# Patient Record
Sex: Female | Born: 1980 | Race: White | Hispanic: Yes | Marital: Married | Smoking: Never smoker
Health system: Southern US, Community
[De-identification: ages and names within clinical notes are randomized; demographics above are authoritative.]

## PROBLEM LIST (undated history)

## (undated) ENCOUNTER — Inpatient Hospital Stay (HOSPITAL_COMMUNITY): Payer: Self-pay

## (undated) DIAGNOSIS — D649 Anemia, unspecified: Secondary | ICD-10-CM

## (undated) DIAGNOSIS — E669 Obesity, unspecified: Secondary | ICD-10-CM

## (undated) DIAGNOSIS — N393 Stress incontinence (female) (male): Secondary | ICD-10-CM

## (undated) HISTORY — PX: NO PAST SURGERIES: SHX2092

## (undated) HISTORY — DX: Obesity, unspecified: E66.9

## (undated) HISTORY — DX: Stress incontinence (female) (male): N39.3

## (undated) HISTORY — DX: Anemia, unspecified: D64.9

---
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
## 2004-01-08 ENCOUNTER — Ambulatory Visit: Payer: Self-pay | Admitting: *Deleted

## 2004-01-15 ENCOUNTER — Ambulatory Visit (HOSPITAL_COMMUNITY): Admission: RE | Admit: 2004-01-15 | Discharge: 2004-01-15 | Payer: Self-pay | Admitting: *Deleted

## 2004-01-15 ENCOUNTER — Ambulatory Visit: Payer: Self-pay | Admitting: Family Medicine

## 2004-01-22 ENCOUNTER — Ambulatory Visit: Payer: Self-pay | Admitting: *Deleted

## 2004-01-25 ENCOUNTER — Ambulatory Visit: Payer: Self-pay | Admitting: *Deleted

## 2004-01-29 ENCOUNTER — Ambulatory Visit: Payer: Self-pay | Admitting: *Deleted

## 2004-02-05 ENCOUNTER — Ambulatory Visit: Payer: Self-pay | Admitting: *Deleted

## 2004-02-07 ENCOUNTER — Inpatient Hospital Stay (HOSPITAL_COMMUNITY): Admission: AD | Admit: 2004-02-07 | Discharge: 2004-02-09 | Payer: Self-pay | Admitting: Obstetrics & Gynecology

## 2004-02-07 ENCOUNTER — Ambulatory Visit: Payer: Self-pay | Admitting: Obstetrics & Gynecology

## 2010-05-31 ENCOUNTER — Emergency Department (HOSPITAL_COMMUNITY): Payer: Self-pay

## 2010-05-31 ENCOUNTER — Emergency Department (HOSPITAL_COMMUNITY)
Admission: EM | Admit: 2010-05-31 | Discharge: 2010-05-31 | Disposition: A | Discharge status: Home / Self Care | Payer: Self-pay | Attending: Emergency Medicine | Admitting: Emergency Medicine

## 2010-05-31 DIAGNOSIS — R0789 Other chest pain: Secondary | ICD-10-CM | POA: Insufficient documentation

## 2010-05-31 LAB — DIFFERENTIAL
Basophils Absolute: 0 10*3/uL (ref 0.0–0.1)
Basophils Relative: 0 % (ref 0–1)
Eosinophils Absolute: 0.2 10*3/uL (ref 0.0–0.7)
Eosinophils Relative: 2 % (ref 0–5)
Lymphocytes Relative: 42 % (ref 12–46)
Lymphs Abs: 2.7 10*3/uL (ref 0.7–4.0)
Monocytes Absolute: 0.6 10*3/uL (ref 0.1–1.0)
Monocytes Relative: 9 % (ref 3–12)
Neutro Abs: 3 10*3/uL (ref 1.7–7.7)
Neutrophils Relative %: 46 % (ref 43–77)

## 2010-05-31 LAB — POCT CARDIAC MARKERS
CKMB, poc: <1 ng/mL — ABNORMAL LOW (ref 1.0–8.0)
Myoglobin, poc: 38.7 ng/mL (ref 12–200)
Troponin i, poc: <0.05 ng/mL (ref 0.00–0.09)

## 2010-05-31 LAB — CBC
HCT: 38 % (ref 36.0–46.0)
Hemoglobin: 12.9 g/dL (ref 12.0–15.0)
MCHC: 33.9 g/dL (ref 30.0–36.0)
MCV: 84.6 fL (ref 78.0–100.0)
RDW: 12.2 % (ref 11.5–15.5)

## 2010-05-31 LAB — BASIC METABOLIC PANEL
BUN: 16 mg/dL (ref 6–23)
CO2: 25 mEq/L (ref 19–32)
Calcium: 8.8 mg/dL (ref 8.4–10.5)
GFR calc non Af Amer: >60 mL/min (ref >60)
Glucose, Bld: 92 mg/dL (ref 70–99)
Potassium: 3.8 mEq/L (ref 3.5–5.1)
Sodium: 139 mEq/L (ref 135–145)

## 2010-07-25 ENCOUNTER — Emergency Department (HOSPITAL_COMMUNITY)
Admission: EM | Admit: 2010-07-25 | Discharge: 2010-07-26 | Disposition: A | Discharge status: Home / Self Care | Payer: Self-pay | Attending: Emergency Medicine | Admitting: Emergency Medicine

## 2010-07-25 DIAGNOSIS — L259 Unspecified contact dermatitis, unspecified cause: Secondary | ICD-10-CM | POA: Insufficient documentation

## 2010-07-25 DIAGNOSIS — R21 Rash and other nonspecific skin eruption: Secondary | ICD-10-CM | POA: Insufficient documentation

## 2010-07-25 DIAGNOSIS — L298 Other pruritus: Secondary | ICD-10-CM | POA: Insufficient documentation

## 2010-07-25 DIAGNOSIS — L509 Urticaria, unspecified: Secondary | ICD-10-CM | POA: Insufficient documentation

## 2010-07-25 DIAGNOSIS — L2989 Other pruritus: Secondary | ICD-10-CM | POA: Insufficient documentation

## 2013-12-01 ENCOUNTER — Ambulatory Visit: Payer: Self-pay | Admitting: Gynecology

## 2013-12-05 ENCOUNTER — Other Ambulatory Visit (HOSPITAL_COMMUNITY): Payer: Self-pay | Admitting: *Deleted

## 2013-12-05 DIAGNOSIS — N644 Mastodynia: Secondary | ICD-10-CM

## 2013-12-18 ENCOUNTER — Ambulatory Visit: Payer: Self-pay | Admitting: Gynecology

## 2014-01-03 ENCOUNTER — Encounter (HOSPITAL_COMMUNITY): Payer: Self-pay | Admitting: *Deleted

## 2014-01-04 ENCOUNTER — Ambulatory Visit
Admission: RE | Admit: 2014-01-04 | Discharge: 2014-01-04 | Disposition: A | Discharge status: Home / Self Care | Payer: No Typology Code available for payment source | Source: Ambulatory Visit | Attending: Obstetrics and Gynecology | Admitting: Obstetrics and Gynecology

## 2014-01-04 ENCOUNTER — Ambulatory Visit (HOSPITAL_COMMUNITY)
Admission: RE | Admit: 2014-01-04 | Discharge: 2014-01-04 | Disposition: A | Discharge status: Home / Self Care | Payer: Self-pay | Source: Ambulatory Visit | Attending: Obstetrics and Gynecology | Admitting: Obstetrics and Gynecology

## 2014-01-04 ENCOUNTER — Encounter (HOSPITAL_COMMUNITY): Payer: Self-pay

## 2014-01-04 VITALS — BP 104/60 | Temp 97.9°F | Ht 63.0 in | Wt 167.6 lb

## 2014-01-04 DIAGNOSIS — N644 Mastodynia: Secondary | ICD-10-CM

## 2014-01-04 DIAGNOSIS — Z1239 Encounter for other screening for malignant neoplasm of breast: Secondary | ICD-10-CM

## 2014-01-04 NOTE — Patient Instructions (Signed)
Explained  Caitlin MaserLucy [name] that she did not need a Pap smear today due to last Pap smear was 07/13/2012. Let her know BCCCP will cover Pap smears every 3 years unless has a history of abnormal Pap smears. [facility] and possible right breast ultrasound. Appointment scheduled for Thursday, January 04, 2014 at 1030. Patient aware of appointment and will be there. Caitlin MaserLucy [name] verbalized understanding.  [name], Caitlin Maserhristine Poll, RN 9:04 AM

## 2014-01-04 NOTE — Progress Notes (Signed)
Complaints of left nipple pain that burns. Patient stated the pain comes and goes. Patient rates pain at a 5 out of 10.  Pap Smear: Pap smear not completed today. [facility] and normal. Per patient has no history of an abnormal Pap smear. Last Pap smear result is scanned into EPIC under media.  Physical exam: Breasts Breasts symmetrical. No skin abnormalities bilateral breasts. No nipple retraction bilateral breasts. No nipple discharge bilateral breasts. No lymphadenopathy. No lumps palpated bilateral breasts. Complaints of some tenderness when palpated area around left nipple. [facility] and possible right breast ultrasound. Appointment scheduled for Thursday, January 04, 2014 at 1030.  Pelvic/Bimanual No Pap smear completed today since last Pap smear was 07/13/2012. Pap smear not indicated per BCCCP guidelines.

## 2014-05-08 ENCOUNTER — Ambulatory Visit (INDEPENDENT_AMBULATORY_CARE_PROVIDER_SITE_OTHER): Payer: Self-pay | Admitting: Internal Medicine

## 2014-05-08 VITALS — BP 103/66 | HR 78 | Temp 98.8°F | Resp 20 | Ht 62.0 in | Wt 169.0 lb

## 2014-05-08 DIAGNOSIS — J029 Acute pharyngitis, unspecified: Secondary | ICD-10-CM

## 2014-05-08 MED ORDER — AMOXICILLIN 875 MG PO TABS: 875.0000 mg | ORAL_TABLET | Freq: Two times a day (BID) | ORAL | Status: DC

## 2014-05-08 MED ORDER — LIDOCAINE VISCOUS 2 % MT SOLN: OROMUCOSAL | Status: DC

## 2014-05-08 NOTE — Progress Notes (Signed)
   Subjective:    Patient ID: Caitlin [name], female    DOB: 06-23-1980, 34 y.o.   MRN: [id number] This chart was scribed for Ellamae Siaobert Annayah Worthley, MD by Murriel HopperAlec Bankhead, ED Scribe. The patient's care was started at 8:29 PM.  Chief Complaint  Patient presents with  . Sore Throat      HPI HPI Comments: Caitlin MaserLucy Kyer is a 34 y.o. female who presents to [facility] complaining of a constant, worsening sore throat with associated rhinorrhea that has been present for over a week. Pt denies having seasonal allergies. Pt states pain becomes worse with eating and drinking. Pt denies cough, fever, itching eyes.    Review of Systems  Constitutional: Negative for fever.  HENT: Positive for rhinorrhea and sore throat.   Eyes: Negative for itching.  Respiratory: Negative for cough.        Objective:   Physical Exam  Constitutional: She is oriented to person, place, and time. She appears well-developed and well-nourished.  HENT:  Head: Normocephalic and atraumatic.  Nose clear Throat erythematous with vesicles in posterior pharynx Antero cervical lymph notes tender  Cardiovascular: Normal rate.   Pulmonary/Chest: Effort normal.  Abdominal: She exhibits no distension.  Neurological: She is alert and oriented to person, place, and time.  Skin: Skin is warm and dry.  Psychiatric: She has a normal mood and affect.  Nursing note and vitals reviewed.     Assessment & Plan:  Acute pharyngitis, unspecified pharyngitis type  Bacterial suspected Meds ordered this encounter  Medications  . amoxicillin (AMOXIL) 875 MG tablet    Sig: Take 1 tablet (875 mg total) by mouth 2 (two) times daily.    Dispense:  20 tablet    Refill:  0  . lidocaine (XYLOCAINE) 2 % solution    Sig: Use 1 teaspoon every 2 hours to swish and swallow or spit as needed for pain    Dispense:  60 mL    Refill:  0     I have completed the patient encounter in its entirety as documented by the scribe, with  editing by me where necessary. Thang Flett P. Merla Richesoolittle, M.D.

## 2014-06-12 ENCOUNTER — Encounter (HOSPITAL_COMMUNITY): Payer: Self-pay | Admitting: Emergency Medicine

## 2014-06-12 ENCOUNTER — Emergency Department (HOSPITAL_COMMUNITY)
Admission: EM | Admit: 2014-06-12 | Discharge: 2014-06-12 | Disposition: A | Discharge status: Home / Self Care | Payer: No Typology Code available for payment source | Attending: Emergency Medicine | Admitting: Emergency Medicine

## 2014-06-12 DIAGNOSIS — S199XXA Unspecified injury of neck, initial encounter: Secondary | ICD-10-CM | POA: Insufficient documentation

## 2014-06-12 DIAGNOSIS — Z862 Personal history of diseases of the blood and blood-forming organs and certain disorders involving the immune mechanism: Secondary | ICD-10-CM | POA: Insufficient documentation

## 2014-06-12 DIAGNOSIS — Y998 Other external cause status: Secondary | ICD-10-CM | POA: Insufficient documentation

## 2014-06-12 DIAGNOSIS — O9A211 Injury, poisoning and certain other consequences of external causes complicating pregnancy, first trimester: Secondary | ICD-10-CM | POA: Diagnosis not present

## 2014-06-12 DIAGNOSIS — Y9389 Activity, other specified: Secondary | ICD-10-CM | POA: Diagnosis not present

## 2014-06-12 DIAGNOSIS — Z3A08 8 weeks gestation of pregnancy: Secondary | ICD-10-CM | POA: Diagnosis not present

## 2014-06-12 DIAGNOSIS — Y92410 Unspecified street and highway as the place of occurrence of the external cause: Secondary | ICD-10-CM | POA: Insufficient documentation

## 2014-06-12 DIAGNOSIS — S4990XA Unspecified injury of shoulder and upper arm, unspecified arm, initial encounter: Secondary | ICD-10-CM | POA: Diagnosis not present

## 2014-06-12 DIAGNOSIS — S3992XA Unspecified injury of lower back, initial encounter: Secondary | ICD-10-CM | POA: Insufficient documentation

## 2014-06-12 DIAGNOSIS — Z3490 Encounter for supervision of normal pregnancy, unspecified, unspecified trimester: Secondary | ICD-10-CM
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
## 2014-07-04 ENCOUNTER — Encounter (HOSPITAL_COMMUNITY): Payer: Self-pay | Admitting: *Deleted

## 2014-07-04 ENCOUNTER — Inpatient Hospital Stay (HOSPITAL_COMMUNITY)
Admission: AD | Admit: 2014-07-04 | Discharge: 2014-07-05 | Disposition: A | Discharge status: Home / Self Care | Payer: Medicaid Other | Source: Ambulatory Visit | Attending: Obstetrics and Gynecology | Admitting: Obstetrics and Gynecology

## 2014-07-04 DIAGNOSIS — O99891 Other specified diseases and conditions complicating pregnancy: Secondary | ICD-10-CM

## 2014-07-04 DIAGNOSIS — Z3A11 11 weeks gestation of pregnancy: Secondary | ICD-10-CM | POA: Diagnosis not present

## 2014-07-04 DIAGNOSIS — O9989 Other specified diseases and conditions complicating pregnancy, childbirth and the puerperium: Secondary | ICD-10-CM | POA: Diagnosis not present

## 2014-07-04 DIAGNOSIS — R1030 Lower abdominal pain, unspecified: Secondary | ICD-10-CM | POA: Insufficient documentation

## 2014-07-04 DIAGNOSIS — M549 Dorsalgia, unspecified: Secondary | ICD-10-CM | POA: Diagnosis not present

## 2014-07-04 DIAGNOSIS — O26899 Other specified pregnancy related conditions, unspecified trimester: Secondary | ICD-10-CM

## 2014-07-04 LAB — CBC
HCT: 30.9 % — ABNORMAL LOW (ref 36.0–46.0)
Hemoglobin: 11 g/dL — ABNORMAL LOW (ref 12.0–15.0)
MCH: 29.2 pg (ref 26.0–34.0)
MCHC: 35.6 g/dL (ref 30.0–36.0)
MCV: 82 fL (ref 78.0–100.0)
PLATELETS: 211 10*3/uL (ref 150–400)
RBC: 3.77 MIL/uL — AB (ref 3.87–5.11)
RDW: 12.5 % (ref 11.5–15.5)
WBC: 7.3 10*3/uL (ref 4.0–10.5)

## 2014-07-04 LAB — WET PREP, GENITAL
Clue Cells Wet Prep HPF POC: NONE SEEN
Trich, Wet Prep: NONE SEEN
YEAST WET PREP: NONE SEEN

## 2014-07-04 LAB — URINALYSIS, ROUTINE W REFLEX MICROSCOPIC
BILIRUBIN URINE: NEGATIVE
Glucose, UA: NEGATIVE mg/dL
HGB URINE DIPSTICK: NEGATIVE
KETONES UR: NEGATIVE mg/dL
Leukocytes, UA: NEGATIVE
NITRITE: NEGATIVE
PH: 6 (ref 5.0–8.0)
Protein, ur: NEGATIVE mg/dL
Specific Gravity, Urine: 1.02 (ref 1.005–1.030)
Urobilinogen, UA: 0.2 mg/dL (ref 0.0–1.0)

## 2014-07-04 LAB — OB RESULTS CONSOLE GC/CHLAMYDIA: GC PROBE AMP, GENITAL: NEGATIVE

## 2014-07-04 LAB — POCT PREGNANCY, URINE: PREG TEST UR: POSITIVE — AB

## 2014-07-04 MED ORDER — IBUPROFEN 800 MG PO TABS
800.0000 mg | ORAL_TABLET | Freq: Once | ORAL | Status: AC
  Administered 2014-07-04: 800 mg via ORAL
  Filled 2014-07-04: qty 1

## 2014-07-04 MED ORDER — CYCLOBENZAPRINE HCL 10 MG PO TABS: 10.0000 mg | ORAL_TABLET | Freq: Two times a day (BID) | ORAL | Status: DC | PRN

## 2014-07-04 NOTE — MAU Note (Signed)
[facility].  Lower back and abdominal pain, intermittent, x 3 weeks. "tiny drops of blood" when she goes to the bathroom. Denies vaginal discharge.
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
## 2014-07-04 NOTE — Discharge Instructions (Signed)
Ejercicios para la espalda  (Back Exercises)  Estos ejercicios ayudan a tratar y a prevenir lesiones en la espalda. El objetivo es aumentar la fuerza en los músculos del vientre (abdomen) y de la espalda. Estos ejercicios también lo ayudarán a mejorar la flexibilidad. Comience a realizar estos ejercicios cuando el médico se lo indique.  CUIDADOS EN EL HOGAR  Los ejercicios para la espalda incluyen:  Inclinación de la pelvis.  · Recuéstese sobre la espalda con las rodillas flexionadas. Incline la pelvis hasta que la parte inferior de la espalda se apoye en el piso. Mantenga esta posición durante 5 a 10 segundos. Repita este ejercicio 5 a 10 veces.  Rodilla al pecho.  · Empuje con la rodilla contra el pecho y mantenga esta posición durante 20 a 30 segundos. Repita con la otra pierna. Esto puede realizarlo con la otra pierna extendida o flexionada, del modo en que se sienta más cómodo. Luego presione ambas rodillas contra el pecho.  Abdominales.  · Doble las rodillas a 90 grados. Comience doblando la pelvis y haga un abdominal parcial y en forma lenta. Sólo eleve la parte superior a 30 ó 45 grados del suelo. Emplee al menos entre 2 y 3 segundos para cada abdominal. No haga los abdominales con las rodillas extendidas. Si hacer abdominales parciales le resulta difícil, simplemente haga el ejercicio pero sólo endureciendo los músculos del vientre(abdomen) y manteniendo según la indicación.  Elevar la cadera.  · Recuéstese sobre la espalda con las rodillas flexionadas a 90 grados. Presione con los pies y los hombros a medida que eleva las caderas a 5 cm del suelo. Mantenga durante 10 segundos y repita 5 a 10 veces.  Arquear la espalda.  · Acuéstese sobre el estómago. Levántese apoyando los codos doblados. Presione lentamente con las manos, formando un arco con la zona inferior de la espalda. Repita entre 3 y 5 veces.  Elevar los hombros.  · Acuéstese boca abajo con los brazos a los lados del cuerpo. Presione las caderas y  el torso contra el suelo mientras eleva lentamente la cabeza y los hombros del suelo.  No exagere al hacer los ejercicios. Tenga cuidado al principio. Los ejercicios pueden causar algunas molestias leves en la espalda. Si el dolor dura más de 15 minutos, detenga los ejercicios hasta que consulte al médico. Los problemas en la espalda mejoran de manera lenta con esta terapia.   Document Released: 04/22/2010 Document Revised: 03/30/2011  ExitCare® Patient Information ©2015 [employer]. This information is not intended to replace advice given to you by your health care provider. Make sure you discuss any questions you have with your health care provider.

## 2014-07-05 LAB — HIV ANTIBODY (ROUTINE TESTING W REFLEX): HIV SCREEN 4TH GENERATION: NONREACTIVE

## 2014-07-05 LAB — GC/CHLAMYDIA PROBE AMP (~~LOC~~) NOT AT ARMC
CHLAMYDIA, DNA PROBE: NEGATIVE
NEISSERIA GONORRHEA: NEGATIVE

## 2014-07-26 ENCOUNTER — Encounter: Payer: Self-pay | Admitting: Obstetrics

## 2014-07-30 ENCOUNTER — Encounter: Payer: Self-pay | Admitting: Obstetrics

## 2014-07-30 ENCOUNTER — Ambulatory Visit (INDEPENDENT_AMBULATORY_CARE_PROVIDER_SITE_OTHER): Payer: Medicaid Other | Admitting: Obstetrics

## 2014-07-30 VITALS — BP 106/62 | HR 84 | Temp 98.6°F | Wt 168.0 lb

## 2014-07-30 DIAGNOSIS — Z3482 Encounter for supervision of other normal pregnancy, second trimester: Secondary | ICD-10-CM

## 2014-07-30 LAB — POCT URINALYSIS DIPSTICK
Bilirubin, UA: NEGATIVE
GLUCOSE UA: NEGATIVE
Ketones, UA: NEGATIVE
LEUKOCYTES UA: NEGATIVE
Nitrite, UA: NEGATIVE
PH UA: 7
Protein, UA: NEGATIVE
RBC UA: NEGATIVE
Spec Grav, UA: 1.01
UROBILINOGEN UA: NEGATIVE

## 2014-07-30 MED ORDER — OB COMPLETE PETITE 35-5-1-200 MG PO CAPS: 1.0000 | ORAL_CAPSULE | Freq: Every day | ORAL | Status: DC

## 2014-07-31 ENCOUNTER — Encounter: Payer: Self-pay | Admitting: Obstetrics

## 2014-07-31 LAB — OBSTETRIC PANEL
ANTIBODY SCREEN: NEGATIVE
BASOS PCT: 0 % (ref 0–1)
Basophils Absolute: 0 10*3/uL (ref 0.0–0.1)
EOS ABS: 0.1 10*3/uL (ref 0.0–0.7)
EOS PCT: 1 % (ref 0–5)
HCT: 35.8 % — ABNORMAL LOW (ref 36.0–46.0)
Hemoglobin: 12.2 g/dL (ref 12.0–15.0)
Hepatitis B Surface Ag: NEGATIVE
LYMPHS PCT: 23 % (ref 12–46)
Lymphs Abs: 1.3 10*3/uL (ref 0.7–4.0)
MCH: 29.4 pg (ref 26.0–34.0)
MCHC: 34.1 g/dL (ref 30.0–36.0)
MCV: 86.3 fL (ref 78.0–100.0)
MPV: 10.1 fL (ref 8.6–12.4)
Monocytes Absolute: 0.4 10*3/uL (ref 0.1–1.0)
Monocytes Relative: 7 % (ref 3–12)
NEUTROS ABS: 4 10*3/uL (ref 1.7–7.7)
Neutrophils Relative %: 69 % (ref 43–77)
PLATELETS: 228 10*3/uL (ref 150–400)
RBC: 4.15 MIL/uL (ref 3.87–5.11)
RDW: 13.4 % (ref 11.5–15.5)
RH TYPE: POSITIVE
RUBELLA: 15.5 {index} — AB (ref <0.90)
WBC: 5.8 10*3/uL (ref 4.0–10.5)

## 2014-07-31 LAB — VITAMIN D 25 HYDROXY (VIT D DEFICIENCY, FRACTURES): Vit D, 25-Hydroxy: 27 ng/mL — ABNORMAL LOW (ref 30–100)

## 2014-07-31 LAB — HIV ANTIBODY (ROUTINE TESTING W REFLEX): HIV 1&2 Ab, 4th Generation: NONREACTIVE

## 2014-07-31 LAB — CULTURE, OB URINE
Colony Count: NO GROWTH
ORGANISM ID, BACTERIA: NO GROWTH

## 2014-07-31 LAB — VARICELLA ZOSTER ANTIBODY, IGG: VARICELLA IGG: 575.7 {index} — AB (ref <135.00)

## 2014-07-31 NOTE — Progress Notes (Signed)
Subjective:    Caitlin [name] is being seen today for her first obstetrical visit.  This is a planned pregnancy. She is at 7106w4d gestation. Her obstetrical history is significant for none. Relationship with FOB: spouse, living together. Patient does intend to breast feed. Pregnancy history fully reviewed.  The information documented in the HPI was reviewed and verified.  Menstrual History: OB History    Gravida Para Term Preterm AB TAB SAB Ectopic Multiple Living   4 3 3  0 0 0 0 0 0 3       Patient's last menstrual period was 04/13/2014.    Past Medical History  Diagnosis Date  . Anemia     History reviewed. No pertinent past surgical history.   (Not in a hospital admission) No Known Allergies  History  Substance Use Topics  . Smoking status: Never Smoker   . Smokeless tobacco: Never Used  . Alcohol Use: No    Family History  Problem Relation Age of Onset  . Hypertension Paternal Grandmother   . Diabetes Paternal Grandmother      Review of Systems Constitutional: negative for weight loss Gastrointestinal: negative for vomiting Genitourinary:negative for genital lesions and vaginal discharge and dysuria Musculoskeletal:negative for back pain Behavioral/Psych: negative for abusive relationship, depression, illegal drug usage and tobacco use    Objective:    BP 106/62 mmHg  Pulse 84  Temp(Src) 98.6 F (37 C)  Wt 168 lb (76.204 kg)  LMP 04/13/2014 General Appearance:    Alert, cooperative, no distress, appears stated age  Head:    Normocephalic, without obvious abnormality, atraumatic  Eyes:    PERRL, conjunctiva/corneas clear, EOM's intact, fundi    benign, both eyes  Ears:    Normal TM's and external ear canals, both ears  Nose:   Nares normal, septum midline, mucosa normal, no drainage    or sinus tenderness  Throat:   Lips, mucosa, and tongue normal; teeth and gums normal  Neck:   Supple, symmetrical, trachea midline, no adenopathy;    thyroid:  no  enlargement/tenderness/nodules; no carotid   bruit or JVD  Back:     Symmetric, no curvature, ROM normal, no CVA tenderness  Lungs:     Clear to auscultation bilaterally, respirations unlabored  Chest Wall:    No tenderness or deformity   Heart:    Regular rate and rhythm, S1 and S2 normal, no murmur, rub   or gallop  Breast Exam:    No tenderness, masses, or nipple abnormality  Abdomen:     Soft, non-tender, bowel sounds active all four quadrants,    no masses, no organomegaly  Genitalia:    Normal female without lesion, discharge or tenderness  Extremities:   Extremities normal, atraumatic, no cyanosis or edema  Pulses:   2+ and symmetric all extremities  Skin:   Skin color, texture, turgor normal, no rashes or lesions  Lymph nodes:   Cervical, supraclavicular, and axillary nodes normal  Neurologic:   CNII-XII intact, normal strength, sensation and reflexes    throughout      Lab Review Urine pregnancy test Labs reviewed yes Radiologic studies reviewed no Assessment:    Pregnancy at 76106w4d weeks    Plan:      Prenatal vitamins.  Counseling provided regarding continued use of seat belts, cessation of alcohol consumption, smoking or use of illicit drugs; infection precautions i.e., influenza/TDAP immunizations, toxoplasmosis,CMV, parvovirus, listeria and varicella; workplace safety, exercise during pregnancy; routine dental care, safe medications, sexual activity, hot tubs, saunas,  pools, travel, caffeine use, fish and methlymercury, potential toxins, hair treatments, varicose veins Weight gain recommendations per IOM guidelines reviewed: underweight/BMI< 18.5--> gain 28 - 40 lbs; normal weight/BMI 18.5 - 24.9--> gain 25 - 35 lbs; overweight/BMI 25 - 29.9--> gain 15 - 25 lbs; obese/BMI >30->gain  11 - 20 lbs Problem list reviewed and updated. FIRST/CF mutation testing/NIPT/QUAD SCREEN/fragile X/Ashkenazi Jewish population testing/Spinal muscular atrophy discussed: requested. Role of  ultrasound in pregnancy discussed; fetal survey: requested. Amniocentesis discussed: not indicated. VBAC calculator score: VBAC consent form provided Meds ordered this encounter  Medications  . Prenatal Multivit-Min-Fe-FA (PRENATAL VITAMINS PO)    Sig: Take by mouth.  . Prenat-FeCbn-FeAspGl-FA-Omega (OB COMPLETE PETITE) 35-5-1-200 MG CAPS    Sig: Take 1 capsule by mouth daily before breakfast.    Dispense:  90 capsule    Refill:  3   Orders Placed This Encounter  Procedures  . Culture, OB Urine  . US OB Comp + 14 Wk    Standing Status: Future     Number of Occurrences:      Standing Expiration Date: 09/30/2015    Order Specific Question:  Reason for Exam (SYMPTOM  OR DIAGNOSIS REQUIRED)    Answer:  anatomy    Order Specific Question:  Preferred imaging location?    Answer:  Internal  . Obstetric panel  . HIV antibody  . Hemoglobinopathy evaluation  . Varicella zoster antibody, IgG  . Vit D  25 hydroxy (rtn osteoporosis monitoring)  . POCT urinalysis dipstick    Follow up in 4 weeks.

## 2014-08-01 LAB — HEMOGLOBINOPATHY EVALUATION
HEMOGLOBIN OTHER: 0 %
HGB A2 QUANT: 2.8 % (ref 2.2–3.2)
HGB S QUANTITAION: 0 %
Hgb A: 97.2 % (ref 96.8–97.8)
Hgb F Quant: 0 % (ref 0.0–2.0)

## 2014-08-11 ENCOUNTER — Encounter (HOSPITAL_COMMUNITY): Payer: Self-pay | Admitting: *Deleted

## 2014-08-11 ENCOUNTER — Inpatient Hospital Stay (HOSPITAL_COMMUNITY)
Admission: AD | Admit: 2014-08-11 | Discharge: 2014-08-11 | Disposition: A | Discharge status: Home / Self Care | Payer: Medicaid Other | Source: Ambulatory Visit | Attending: Obstetrics | Admitting: Obstetrics

## 2014-08-11 DIAGNOSIS — R1030 Lower abdominal pain, unspecified: Secondary | ICD-10-CM | POA: Insufficient documentation

## 2014-08-11 DIAGNOSIS — R102 Pelvic and perineal pain: Secondary | ICD-10-CM | POA: Insufficient documentation

## 2014-08-11 DIAGNOSIS — O9989 Other specified diseases and conditions complicating pregnancy, childbirth and the puerperium: Secondary | ICD-10-CM | POA: Insufficient documentation

## 2014-08-11 DIAGNOSIS — N949 Unspecified condition associated with female genital organs and menstrual cycle: Secondary | ICD-10-CM | POA: Diagnosis not present

## 2014-08-11 DIAGNOSIS — Z3A17 17 weeks gestation of pregnancy: Secondary | ICD-10-CM | POA: Diagnosis not present

## 2014-08-11 LAB — URINALYSIS, ROUTINE W REFLEX MICROSCOPIC
Bilirubin Urine: NEGATIVE
GLUCOSE, UA: NEGATIVE mg/dL
Hgb urine dipstick: NEGATIVE
Ketones, ur: 15 mg/dL — AB
LEUKOCYTES UA: NEGATIVE
NITRITE: NEGATIVE
PROTEIN: NEGATIVE mg/dL
Specific Gravity, Urine: >1.03 — ABNORMAL HIGH (ref 1.005–1.030)
Urobilinogen, UA: 0.2 mg/dL (ref 0.0–1.0)
pH: 5.5 (ref 5.0–8.0)

## 2014-08-11 NOTE — MAU Provider Note (Signed)
  History     CSN: [id number]  Arrival date and time: 08/11/14 4098   First Provider Initiated Contact with Patient 08/11/14 0252      Chief Complaint  Patient presents with  . Abdominal Pain   Abdominal Pain Pertinent negatives include no fever.    Caitlin [name] 34 y.o. [phone] @ [redacted]w[redacted]d and describes lower abdominal pain when she is moving around. She denies vaginal bleeding, LOF. Has not felt fetal movement yet.    Past Medical History  Diagnosis Date  . Anemia     Past Surgical History  Procedure Laterality Date  . No past surgeries      Family History  Problem Relation Age of Onset  . Hypertension Paternal Grandmother   . Diabetes Paternal Grandmother     History  Substance Use Topics  . Smoking status: Never Smoker   . Smokeless tobacco: Never Used  . Alcohol Use: No    Allergies: No Known Allergies  Prescriptions prior to admission  Medication Sig Dispense Refill Last Dose  . Prenatal Multivit-Min-Fe-FA (PRENATAL VITAMINS PO) Take by mouth.   08/10/2014 at Unknown time  . cyclobenzaprine (FLEXERIL) 10 MG tablet Take 1 tablet (10 mg total) by mouth 2 (two) times daily as needed for muscle spasms. (Patient not taking: Reported on 07/30/2014) 20 tablet 0 Not Taking  . Prenat-FeCbn-FeAspGl-FA-Omega (OB COMPLETE PETITE) 35-5-1-200 MG CAPS Take 1 capsule by mouth daily before breakfast. 90 capsule 3     Review of Systems  Constitutional: Negative for fever.  Gastrointestinal: Positive for abdominal pain.  All other systems reviewed and are negative.  Physical Exam   Blood pressure 103/55, pulse 74, temperature 98 F (36.7 C), resp. rate 18, height  (1.6 m), weight 76.386 kg (168 lb 6.4 oz), last menstrual period 04/13/2014.  Physical Exam  Nursing note and vitals reviewed. Constitutional: She is oriented to person, place, and time. She appears well-developed and well-nourished. No distress.  Cardiovascular: Normal rate.   Respiratory: Effort normal.   GI: Soft. There is no tenderness. There is no rebound.  Neurological: She is alert and oriented to person, place, and time.  Skin: Skin is warm and dry.  Psychiatric: She has a normal mood and affect. Her behavior is normal. Judgment and thought content normal.   Results for orders placed or performed during the hospital encounter of 08/11/14 (from the past 24 hour(s))  Urinalysis, Routine w reflex microscopic (not at [facility])     Status: Abnormal   Collection Time: 08/11/14  1:00 AM  Result Value Ref Range   Color, Urine YELLOW YELLOW   APPearance CLEAR CLEAR   Specific Gravity, Urine >1.030 (H) 1.005 - 1.030   pH 5.5 5.0 - 8.0   Glucose, UA NEGATIVE NEGATIVE mg/dL   Hgb urine dipstick NEGATIVE NEGATIVE   Bilirubin Urine NEGATIVE NEGATIVE   Ketones, ur 15 (A) NEGATIVE mg/dL   Protein, ur NEGATIVE NEGATIVE mg/dL   Urobilinogen, UA 0.2 0.0 - 1.0 mg/dL   Nitrite NEGATIVE NEGATIVE   Leukocytes, UA NEGATIVE NEGATIVE    MAU Course  Procedures  MDM Reassured mother that she is expereincing round ligament pain and it is normal. She was worried about not feeling the baby move and she was reassured that that is also normal at this gestation. Encouraged her to increase her water intake.  Assessment and Plan Round Ligament Pain  Discharge to home    08/11/2014, 2:53 AM

## 2014-08-11 NOTE — Discharge Instructions (Signed)

## 2014-08-11 NOTE — MAU Note (Signed)
About 2200 was sitting down and felt some pain and pressure and "pop" in lower abd. Denies bleeding or LOF. No pain currently

## 2014-08-17 ENCOUNTER — Other Ambulatory Visit: Payer: Self-pay | Admitting: Certified Nurse Midwife

## 2014-08-22 ENCOUNTER — Telehealth: Payer: Self-pay | Admitting: *Deleted

## 2014-08-22 NOTE — Telephone Encounter (Signed)
Patient did not say why she was calling. 1:31 LM on VM to CB

## 2014-08-27 NOTE — Telephone Encounter (Signed)
Patient has been contacted and states she would like to know the time of her appointment. Patient advised of time of appointment and verbalized understanding.

## 2014-08-30 ENCOUNTER — Encounter: Payer: Self-pay | Admitting: Obstetrics

## 2014-08-30 ENCOUNTER — Ambulatory Visit (INDEPENDENT_AMBULATORY_CARE_PROVIDER_SITE_OTHER): Payer: Medicaid Other

## 2014-08-30 ENCOUNTER — Ambulatory Visit (INDEPENDENT_AMBULATORY_CARE_PROVIDER_SITE_OTHER): Payer: Medicaid Other | Admitting: Obstetrics

## 2014-08-30 VITALS — BP 103/61 | HR 79 | Temp 98.1°F | Wt 168.0 lb

## 2014-08-30 DIAGNOSIS — Z3482 Encounter for supervision of other normal pregnancy, second trimester: Secondary | ICD-10-CM

## 2014-08-30 LAB — US OB COMP + 14 WK

## 2014-08-30 LAB — POCT URINALYSIS DIPSTICK
BILIRUBIN UA: NEGATIVE
Glucose, UA: NEGATIVE
Ketones, UA: NEGATIVE
LEUKOCYTES UA: NEGATIVE
NITRITE UA: NEGATIVE
PH UA: 6
PROTEIN UA: NEGATIVE
RBC UA: NEGATIVE
Spec Grav, UA: 1.015
UROBILINOGEN UA: NEGATIVE

## 2014-08-30 NOTE — Progress Notes (Signed)
Subjective:    Caitlin [name] is a 34 y.o. female being seen today for her obstetrical visit. She is at [redacted]w[redacted]d gestation. Patient reports: no complaints . Fetal movement: normal.  Problem List Items Addressed This Visit    None    Visit Diagnoses    Encounter for supervision of other normal pregnancy in second trimester    -  Primary    Relevant Orders    POCT urinalysis dipstick (Completed)      There are no active problems to display for this patient.  Objective:    BP 103/61 mmHg  Pulse 79  Temp(Src) 98.1 F (36.7 C)  Wt 168 lb (76.204 kg)  LMP 04/13/2014 FHT: 150 BPM  Uterine Size: size equals dates     Assessment:    Pregnancy @ [redacted]w[redacted]d    Plan:    OBGCT: ordered.  Labs, problem list reviewed and updated 2 hr GTT planned Follow up in 4 weeks.

## 2014-09-27 ENCOUNTER — Ambulatory Visit (INDEPENDENT_AMBULATORY_CARE_PROVIDER_SITE_OTHER): Payer: Medicaid Other | Admitting: Obstetrics

## 2014-09-27 ENCOUNTER — Encounter: Payer: Self-pay | Admitting: Obstetrics

## 2014-09-27 VITALS — BP 98/62 | HR 77 | Temp 98.3°F | Wt 170.0 lb

## 2014-09-27 DIAGNOSIS — Z3482 Encounter for supervision of other normal pregnancy, second trimester: Secondary | ICD-10-CM

## 2014-09-27 DIAGNOSIS — K5909 Other constipation: Secondary | ICD-10-CM

## 2014-09-27 DIAGNOSIS — K5904 Chronic idiopathic constipation: Secondary | ICD-10-CM

## 2014-09-27 LAB — POCT URINALYSIS DIPSTICK
BILIRUBIN UA: NEGATIVE
Blood, UA: NEGATIVE
GLUCOSE UA: NEGATIVE
KETONES UA: NEGATIVE
LEUKOCYTES UA: NEGATIVE
NITRITE UA: NEGATIVE
Protein, UA: NEGATIVE
Spec Grav, UA: 1.015
Urobilinogen, UA: NEGATIVE
pH, UA: 5

## 2014-09-27 NOTE — Progress Notes (Signed)
Subjective:    Caitlin [name] is a 34 y.o. female being seen today for her obstetrical visit. She is at [redacted]w[redacted]d gestation. Patient reports: constipation . Fetal movement: normal.  Problem List Items Addressed This Visit    None    Visit Diagnoses    Encounter for supervision of other normal pregnancy in second trimester    -  Primary    Relevant Orders    POCT urinalysis dipstick (Completed)      There are no active problems to display for this patient.  Objective:    BP 98/62 mmHg  Pulse 77  Temp(Src) 98.3 F (36.8 C)  Wt 170 lb (77.111 kg)  LMP 04/13/2014 FHT: 150 BPM  Uterine Size: size equals dates     Assessment:    Pregnancy @ [redacted]w[redacted]d     Constipation  Plan:   Colace and MOM recommended with dietary changes and increased fluids for constipation   OBGCT: ordered for next visit.  Labs, problem list reviewed and updated 2 hr GTT planned Follow up in 4 weeks.

## 2014-10-25 ENCOUNTER — Encounter: Payer: Medicaid Other | Admitting: Obstetrics

## 2014-10-25 ENCOUNTER — Other Ambulatory Visit: Payer: Medicaid Other

## 2014-10-26 ENCOUNTER — Other Ambulatory Visit: Payer: Medicaid Other

## 2014-10-26 ENCOUNTER — Ambulatory Visit (INDEPENDENT_AMBULATORY_CARE_PROVIDER_SITE_OTHER): Payer: Medicaid Other | Admitting: Obstetrics

## 2014-10-26 ENCOUNTER — Encounter: Payer: Self-pay | Admitting: Obstetrics

## 2014-10-26 VITALS — BP 103/60 | HR 86 | Wt 174.0 lb

## 2014-10-26 DIAGNOSIS — Z3483 Encounter for supervision of other normal pregnancy, third trimester: Secondary | ICD-10-CM

## 2014-10-26 LAB — POCT URINALYSIS DIPSTICK
BILIRUBIN UA: NEGATIVE
GLUCOSE UA: 100
Ketones, UA: NEGATIVE
LEUKOCYTES UA: NEGATIVE
NITRITE UA: NEGATIVE
Protein, UA: NEGATIVE
RBC UA: NEGATIVE
Spec Grav, UA: 1.015
Urobilinogen, UA: NEGATIVE
pH, UA: 7

## 2014-10-26 LAB — CBC
HEMATOCRIT: 32.8 % — AB (ref 36.0–46.0)
Hemoglobin: 11.1 g/dL — ABNORMAL LOW (ref 12.0–15.0)
MCH: 29.2 pg (ref 26.0–34.0)
MCHC: 33.8 g/dL (ref 30.0–36.0)
MCV: 86.3 fL (ref 78.0–100.0)
MPV: 10.5 fL (ref 8.6–12.4)
Platelets: 229 10*3/uL (ref 150–400)
RBC: 3.8 MIL/uL — AB (ref 3.87–5.11)
RDW: 13.3 % (ref 11.5–15.5)
WBC: 6.6 10*3/uL (ref 4.0–10.5)

## 2014-10-26 NOTE — Progress Notes (Signed)
Subjective:    Caitlin [name] is a 34 y.o. female being seen today for her obstetrical visit. She is at [redacted]w[redacted]d gestation. Patient reports no complaints. Fetal movement: normal.  Problem List Items Addressed This Visit    None    Visit Diagnoses    Encounter for supervision of other normal pregnancy in third trimester    -  Primary    Relevant Orders    POCT urinalysis dipstick (Completed)    Glucose Tolerance, 2 Hours w/1 Hour    CBC    HIV antibody    RPR      There are no active problems to display for this patient.  Objective:    BP 103/60 mmHg  Pulse 86  Wt 174 lb (78.926 kg)  LMP 04/13/2014 FHT:  150 BPM  Uterine Size: size equals dates  Presentation: unsure     Assessment:    Pregnancy @ [redacted]w[redacted]d weeks   Plan:     labs reviewed, problem list updated Consent signed. GBS sent TDAP offered  Rhogam given for RH negative Pediatrician: discussed. Infant feeding: plans to breastfeed. Maternity leave: discussed. Cigarette smoking: never smoked. Orders Placed This Encounter  Procedures  . Glucose Tolerance, 2 Hours w/1 Hour  . CBC  . HIV antibody  . RPR  . POCT urinalysis dipstick   No orders of the defined types were placed in this encounter.   Follow up in 2 Weeks.

## 2014-10-27 LAB — GLUCOSE TOLERANCE, 2 HOURS W/ 1HR
GLUCOSE: 169 mg/dL (ref 70–170)
Glucose, 2 hour: 108 mg/dL (ref 70–139)
Glucose, Fasting: 87 mg/dL (ref 65–99)

## 2014-10-27 LAB — HIV ANTIBODY (ROUTINE TESTING W REFLEX): HIV 1&2 Ab, 4th Generation: NONREACTIVE

## 2014-10-29 LAB — RPR

## 2014-11-08 ENCOUNTER — Encounter: Payer: Self-pay | Admitting: Obstetrics

## 2014-11-08 ENCOUNTER — Ambulatory Visit (INDEPENDENT_AMBULATORY_CARE_PROVIDER_SITE_OTHER): Payer: Medicaid Other | Admitting: Obstetrics

## 2014-11-08 VITALS — BP 101/63 | HR 78 | Temp 97.6°F | Wt 174.0 lb

## 2014-11-08 DIAGNOSIS — Z3490 Encounter for supervision of normal pregnancy, unspecified, unspecified trimester: Secondary | ICD-10-CM

## 2014-11-08 LAB — POCT URINALYSIS DIPSTICK
Bilirubin, UA: NEGATIVE
Blood, UA: NEGATIVE
Glucose, UA: NEGATIVE
Ketones, UA: NEGATIVE
LEUKOCYTES UA: NEGATIVE
Nitrite, UA: NEGATIVE
PH UA: 8
PROTEIN UA: NEGATIVE
Spec Grav, UA: <=1.005
Urobilinogen, UA: NEGATIVE

## 2014-11-08 NOTE — Progress Notes (Signed)
Subjective:    Caitlin [name] is a 34 y.o. female being seen today for her obstetrical visit. She is at 7841w6d gestation. Patient reports no complaints. Fetal movement: normal.  Problem List Items Addressed This Visit    None    Visit Diagnoses    Prenatal care, unspecified trimester    -  Primary    Relevant Orders    POCT urinalysis dipstick      There are no active problems to display for this patient.  Objective:    BP 101/63 mmHg  Pulse 78  Temp(Src) 97.6 F (36.4 C)  Wt 174 lb (78.926 kg)  LMP 04/13/2014 FHT:  150 BPM  Uterine Size: size equals dates  Presentation: unsure     Assessment:    Pregnancy @ 141w6d weeks   Plan:     labs reviewed, problem list updated Consent signed. GBS sent TDAP offered  Rhogam given for RH negative Pediatrician: discussed. Infant feeding: plans to breastfeed. Maternity leave: discussed. Cigarette smoking: never smoked. Orders Placed This Encounter  Procedures  . POCT urinalysis dipstick   No orders of the defined types were placed in this encounter.   Follow up in 2 Weeks.

## 2014-11-08 NOTE — Progress Notes (Signed)
Patient reports she does have 1-2 contraction/day weak in intensity. Patient has cramping in her left calf for 2 days with foot pain. Patient does have a non bothersome discharge

## 2014-11-13 ENCOUNTER — Inpatient Hospital Stay (HOSPITAL_COMMUNITY): Payer: Medicaid Other

## 2014-11-13 ENCOUNTER — Encounter (HOSPITAL_COMMUNITY): Payer: Self-pay | Admitting: *Deleted

## 2014-11-13 ENCOUNTER — Inpatient Hospital Stay (HOSPITAL_COMMUNITY)
Admission: AD | Admit: 2014-11-13 | Discharge: 2014-11-13 | Disposition: A | Discharge status: Home / Self Care | Payer: Medicaid Other | Source: Ambulatory Visit | Attending: Obstetrics | Admitting: Obstetrics

## 2014-11-13 DIAGNOSIS — O26893 Other specified pregnancy related conditions, third trimester: Secondary | ICD-10-CM

## 2014-11-13 DIAGNOSIS — R102 Pelvic and perineal pain: Secondary | ICD-10-CM

## 2014-11-13 DIAGNOSIS — O4693 Antepartum hemorrhage, unspecified, third trimester: Secondary | ICD-10-CM | POA: Insufficient documentation

## 2014-11-13 DIAGNOSIS — M25551 Pain in right hip: Secondary | ICD-10-CM | POA: Insufficient documentation

## 2014-11-13 DIAGNOSIS — M549 Dorsalgia, unspecified: Secondary | ICD-10-CM | POA: Diagnosis not present

## 2014-11-13 DIAGNOSIS — Z3A30 30 weeks gestation of pregnancy: Secondary | ICD-10-CM | POA: Insufficient documentation

## 2014-11-13 DIAGNOSIS — N949 Unspecified condition associated with female genital organs and menstrual cycle: Secondary | ICD-10-CM

## 2014-11-13 DIAGNOSIS — O2343 Unspecified infection of urinary tract in pregnancy, third trimester: Secondary | ICD-10-CM

## 2014-11-13 DIAGNOSIS — R1030 Lower abdominal pain, unspecified: Secondary | ICD-10-CM | POA: Insufficient documentation

## 2014-11-13 LAB — URINALYSIS, ROUTINE W REFLEX MICROSCOPIC
Bilirubin Urine: NEGATIVE
Glucose, UA: 100 mg/dL — AB
Ketones, ur: 15 mg/dL — AB
LEUKOCYTES UA: NEGATIVE
NITRITE: NEGATIVE
PROTEIN: NEGATIVE mg/dL
UROBILINOGEN UA: 1 mg/dL (ref 0.0–1.0)
pH: 6 (ref 5.0–8.0)

## 2014-11-13 LAB — URINE MICROSCOPIC-ADD ON

## 2014-11-13 MED ORDER — ACETAMINOPHEN 325 MG PO TABS
650.0000 mg | ORAL_TABLET | Freq: Once | ORAL | Status: AC
  Administered 2014-11-13: 650 mg via ORAL
  Filled 2014-11-13: qty 2

## 2014-11-13 MED ORDER — NITROFURANTOIN MONOHYD MACRO 100 MG PO CAPS: 100.0000 mg | ORAL_CAPSULE | Freq: Two times a day (BID) | ORAL | Status: DC

## 2014-11-13 MED ORDER — CYCLOBENZAPRINE HCL 10 MG PO TABS
10.0000 mg | ORAL_TABLET | Freq: Once | ORAL | Status: AC
  Administered 2014-11-13: 10 mg via ORAL
  Filled 2014-11-13: qty 1

## 2014-11-13 NOTE — MAU Provider Note (Signed)
History     CSN: [id number]  Arrival date and time: 11/13/14 1751   First Provider Initiated Contact with Patient 11/13/14 1839      Chief Complaint  Patient presents with  . Vaginal Bleeding   HPI  Pt is [redacted]w[redacted]d [id number] who presents with spotting in pregnancy. Pt states that she also has some lower abd pain And back pain and pain in hips radiating down her hips.  Pt has not taken anything for the pain.Pt denies dysuria, constipation, diarrhea, chills or fever. Pt denies any change in vaginal discharge Baby has been active; no ctx Pt noticed bleeding when went to bathroom and wiped Pt denies complications with pregnancy ; no IC in past 24 hours  Past Medical History  Diagnosis Date  . Anemia     Past Surgical History  Procedure Laterality Date  . No past surgeries      Family History  Problem Relation Age of Onset  . Hypertension Paternal Grandmother   . Diabetes Paternal Grandmother   . Heart disease Paternal Grandmother   . Hearing loss Maternal Grandmother     with age  . Asthma Neg Hx   . Stroke Neg Hx     Social History  Substance Use Topics  . Smoking status: Never Smoker   . Smokeless tobacco: Never Used  . Alcohol Use: No    Allergies: No Known Allergies  Prescriptions prior to admission  Medication Sig Dispense Refill Last Dose  . Prenat-FeCbn-FeAspGl-FA-Omega (OB COMPLETE PETITE) 35-5-1-200 MG CAPS Take 1 capsule by mouth daily before breakfast. 90 capsule 3 Taking  . Prenatal Multivit-Min-Fe-FA (PRENATAL VITAMINS PO) Take by mouth.   Not Taking   Results for orders placed or performed during the hospital encounter of 11/13/14 (from the past 48 hour(s))  Urinalysis, Routine w reflex microscopic (not at [facility])     Status: Abnormal   Collection Time: 11/13/14  6:15 PM  Result Value Ref Range   Color, Urine YELLOW YELLOW   APPearance CLEAR CLEAR   Specific Gravity, Urine >1.030 (H) 1.005 - 1.030   pH 6.0 5.0 - 8.0   Glucose, UA 100 (A) NEGATIVE  mg/dL   Hgb urine dipstick SMALL (A) NEGATIVE   Bilirubin Urine NEGATIVE NEGATIVE   Ketones, ur 15 (A) NEGATIVE mg/dL   Protein, ur NEGATIVE NEGATIVE mg/dL   Urobilinogen, UA 1.0 0.0 - 1.0 mg/dL   Nitrite NEGATIVE NEGATIVE   Leukocytes, UA NEGATIVE NEGATIVE  Urine microscopic-add on     Status: Abnormal   Collection Time: 11/13/14  6:15 PM  Result Value Ref Range   Squamous Epithelial / LPF FEW (A) RARE   WBC, UA 0-2 <3 WBC/hpf   RBC / HPF 0-2 <3 RBC/hpf   Bacteria, UA MANY (A) RARE   Crystals CA OXALATE CRYSTALS (A) NEGATIVE   Urine-Other MUCOUS PRESENT     Review of Systems  Constitutional: Negative for fever and chills.  Gastrointestinal: Positive for abdominal pain (+pelvic pressure ). Negative for nausea, vomiting, diarrhea and constipation.  Genitourinary: Positive for urgency. Negative for dysuria and frequency.       Pressure with urination  Musculoskeletal: Positive for back pain.  Neurological: Positive for headaches.   Physical Exam   Blood pressure 119/64, pulse 101, temperature 98.2 F (36.8 C), temperature source Oral, resp. rate 20, weight 176 lb 9.6 oz (80.105 kg), last menstrual period 04/13/2014.  Physical Exam  Nursing note and vitals reviewed. Constitutional: She is oriented to person, place, and time. She appears  well-developed and well-nourished. No distress.  HENT:  Head: Normocephalic.  Eyes: Pupils are equal, round, and reactive to light.  Neck: Normal range of motion. Neck supple.  Cardiovascular: Normal rate.   Respiratory: Effort normal.  GI: Soft. She exhibits no distension. There is tenderness. There is no rebound and no guarding.  Lower abd tenderness with palpation No ctx palpated or noted on toco FHR reactive baseline 140bpm with 15x15 accelerations and no decelerations noted  Genitourinary:  Small amount of creamy white discharge in vault; cervix parous, long closed, NT; no evidence of any bleeding  Musculoskeletal: Normal range of  motion.  Neurological: She is alert and oriented to person, place, and time.  Skin: Skin is warm and dry.  Lips dry  Psychiatric: She has a normal mood and affect.    MAU Course  Procedures Results for orders placed or performed during the hospital encounter of 11/13/14 (from the past 24 hour(s))  Urinalysis, Routine w reflex microscopic (not at [facility])     Status: Abnormal   Collection Time: 11/13/14  6:15 PM  Result Value Ref Range   Color, Urine YELLOW YELLOW   APPearance CLEAR CLEAR   Specific Gravity, Urine >1.030 (H) 1.005 - 1.030   pH 6.0 5.0 - 8.0   Glucose, UA 100 (A) NEGATIVE mg/dL   Hgb urine dipstick SMALL (A) NEGATIVE   Bilirubin Urine NEGATIVE NEGATIVE   Ketones, ur 15 (A) NEGATIVE mg/dL   Protein, ur NEGATIVE NEGATIVE mg/dL   Urobilinogen, UA 1.0 0.0 - 1.0 mg/dL   Nitrite NEGATIVE NEGATIVE   Leukocytes, UA NEGATIVE NEGATIVE  Urine microscopic-add on     Status: Abnormal   Collection Time: 11/13/14  6:15 PM  Result Value Ref Range   Squamous Epithelial / LPF FEW (A) RARE   WBC, UA 0-2 <3 WBC/hpf   RBC / HPF 0-2 <3 RBC/hpf   Bacteria, UA MANY (A) RARE   Crystals CA OXALATE CRYSTALS (A) NEGATIVE   Urine-Other MUCOUS PRESENT    US Care turned over to Venia CarbonJennifer Pernie Grosso, NP US shows no placenta abruption or previa.  Patient rates her pain 0/10 at the time of discharge  Patient to follow up with Dr. Clearance CootsHarper as scheduled Urine culture pending RX: macrobid  Return to MAU if symptoms worsen.    Assessment and Plan    LINEBERRY,SUSAN 11/13/2014, 7:05 PM   Duane LopeJennifer I Javarious Elsayed, NP 11/14/2014 7:11 AM

## 2014-11-13 NOTE — MAU Note (Addendum)
Went to the restroom and she is bleeding.. No hx of bleeding during the preg. Painless cramps.  Denies low lying or placenta previa.  No recent exams.Marland Kitchen. abd soft on palpation

## 2014-11-15 LAB — CULTURE, OB URINE: Special Requests: NORMAL

## 2014-11-22 ENCOUNTER — Encounter: Payer: Self-pay | Admitting: Obstetrics

## 2014-11-22 ENCOUNTER — Ambulatory Visit (INDEPENDENT_AMBULATORY_CARE_PROVIDER_SITE_OTHER): Payer: Medicaid Other | Admitting: Obstetrics

## 2014-11-22 VITALS — BP 101/61 | HR 99 | Temp 98.2°F | Wt 178.0 lb

## 2014-11-22 DIAGNOSIS — Z3483 Encounter for supervision of other normal pregnancy, third trimester: Secondary | ICD-10-CM

## 2014-11-22 LAB — POCT URINALYSIS DIPSTICK
BILIRUBIN UA: NEGATIVE
GLUCOSE UA: NEGATIVE
Ketones, UA: NEGATIVE
Leukocytes, UA: NEGATIVE
Nitrite, UA: NEGATIVE
Protein, UA: NEGATIVE
RBC UA: NEGATIVE
SPEC GRAV UA: 1.02
UROBILINOGEN UA: NEGATIVE
pH, UA: 5

## 2014-11-22 NOTE — Progress Notes (Signed)
Had small amount of vaginal bleeding last week, [facility], patient has finished rx., and has had no new bleeding.

## 2014-11-22 NOTE — Addendum Note (Signed)
Addended by: Marya LandryFOSTER, Randy Castrejon D on: 11/22/2014 05:01 PM   Modules accepted: Orders

## 2014-11-22 NOTE — Progress Notes (Signed)
Subjective:    Caitlin MaserLucy [name] is a 34 y.o. female being seen today for her obstetrical visit. She is at 709w6d gestation. Patient reports no complaints. Fetal movement: normal.  Problem List Items Addressed This Visit    None     There are no active problems to display for this patient.  Objective:    BP 101/61 mmHg  Pulse 99  Temp(Src) 98.2 F (36.8 C)  Wt 178 lb (80.74 kg)  LMP 04/13/2014 FHT:  150 BPM  Uterine Size: size equals dates  Presentation: unsure     Assessment:    Pregnancy @ 289w6d weeks   Plan:     labs reviewed, problem list updated Consent signed. GBS sent TDAP offered  Rhogam given for RH negative Pediatrician: discussed. Infant feeding: plans to breastfeed. Maternity leave: discussed. Cigarette smoking: never smoked. No orders of the defined types were placed in this encounter.   No orders of the defined types were placed in this encounter.   Follow up in 2 Weeks.

## 2014-12-06 ENCOUNTER — Ambulatory Visit (INDEPENDENT_AMBULATORY_CARE_PROVIDER_SITE_OTHER): Payer: Medicaid Other | Admitting: Obstetrics

## 2014-12-06 ENCOUNTER — Encounter: Payer: Self-pay | Admitting: Obstetrics

## 2014-12-06 VITALS — BP 100/57 | HR 84 | Temp 98.4°F | Wt 178.0 lb

## 2014-12-06 DIAGNOSIS — O3663X1 Maternal care for excessive fetal growth, third trimester, fetus 1: Secondary | ICD-10-CM

## 2014-12-06 DIAGNOSIS — Z3493 Encounter for supervision of normal pregnancy, unspecified, third trimester: Secondary | ICD-10-CM

## 2014-12-06 LAB — POCT URINALYSIS DIPSTICK
BILIRUBIN UA: NEGATIVE
Glucose, UA: NEGATIVE
KETONES UA: NEGATIVE
LEUKOCYTES UA: NEGATIVE
Nitrite, UA: NEGATIVE
PH UA: 7
Protein, UA: NEGATIVE
RBC UA: NEGATIVE
Spec Grav, UA: 1.01
Urobilinogen, UA: NEGATIVE

## 2014-12-06 NOTE — Progress Notes (Signed)
Patient reports she is doing good

## 2014-12-06 NOTE — Progress Notes (Signed)
Subjective:    Caitlin MaserLucy Trochez is a 34 y.o. female being seen today for her obstetrical visit. She is at 9735w6d gestation. Patient reports no complaints. Fetal movement: normal.  Problem List Items Addressed This Visit    None    Visit Diagnoses    Supervision of normal pregnancy, third trimester    -  Primary    Relevant Orders    POCT urinalysis dipstick (Completed)    LGA (large for gestational age) fetus affecting management of mother, third trimester, fetus 1        Relevant Orders    US OB Comp + 14 Wk      There are no active problems to display for this patient.  Objective:    BP 100/57 mmHg  Pulse 84  Temp(Src) 98.4 F (36.9 C)  Wt 178 lb (80.74 kg)  LMP 04/13/2014 FHT:  150 BPM  Uterine Size: Size > Dates  Presentation: unsure     Assessment:    Pregnancy @ 835w6d weeks   Plan:     labs reviewed, problem list updated Consent signed. GBS sent TDAP offered  Rhogam given for RH negative Pediatrician: discussed. Infant feeding: plans to breastfeed. Maternity leave: discussed. Cigarette smoking: never smoked. Orders Placed This Encounter  Procedures  . US OB Comp + 14 Wk    Standing Status: Future     Number of Occurrences:      Standing Expiration Date: 02/05/2016    Order Specific Question:  Reason for Exam (SYMPTOM  OR DIAGNOSIS REQUIRED)    Answer:  size > dates    Order Specific Question:  Preferred imaging location?    Answer:  Internal  . POCT urinalysis dipstick   No orders of the defined types were placed in this encounter.   Follow up in 1 Week.

## 2014-12-18 ENCOUNTER — Other Ambulatory Visit: Payer: Self-pay | Admitting: Certified Nurse Midwife

## 2014-12-20 ENCOUNTER — Ambulatory Visit (INDEPENDENT_AMBULATORY_CARE_PROVIDER_SITE_OTHER): Payer: Medicaid Other

## 2014-12-20 ENCOUNTER — Ambulatory Visit (INDEPENDENT_AMBULATORY_CARE_PROVIDER_SITE_OTHER): Payer: Medicaid Other | Admitting: Obstetrics

## 2014-12-20 ENCOUNTER — Encounter: Payer: Self-pay | Admitting: Obstetrics

## 2014-12-20 VITALS — BP 106/65 | HR 96 | Temp 98.2°F | Wt 182.0 lb

## 2014-12-20 DIAGNOSIS — O3663X1 Maternal care for excessive fetal growth, third trimester, fetus 1: Secondary | ICD-10-CM | POA: Diagnosis not present

## 2014-12-20 DIAGNOSIS — Z1389 Encounter for screening for other disorder: Secondary | ICD-10-CM

## 2014-12-20 DIAGNOSIS — Z3483 Encounter for supervision of other normal pregnancy, third trimester: Secondary | ICD-10-CM

## 2014-12-20 DIAGNOSIS — Z331 Pregnant state, incidental: Secondary | ICD-10-CM

## 2014-12-20 NOTE — Progress Notes (Signed)
Subjective:    Caitlin [name] is a 34 y.o. female being seen today for her obstetrical visit. She is at 2252w6d gestation. Patient reports backache and pressure. Fetal movement: normal.  Problem List Items Addressed This Visit    None    Visit Diagnoses    Supervision of other normal pregnancy, antepartum, third trimester    -  Primary    Relevant Orders    POCT urinalysis dipstick    Strep B DNA probe      There are no active problems to display for this patient.  Objective:    BP 106/65 mmHg  Pulse 96  Temp(Src) 98.2 F (36.8 C)  Wt 182 lb (82.555 kg)  LMP 04/13/2014 FHT:  150 BPM  Uterine Size: size equals dates  Presentation: cephalic     Assessment:    Pregnancy @ 2252w6d weeks   Plan:     labs reviewed, problem list updated Consent signed. GBS sent TDAP offered  Rhogam given for RH negative Pediatrician: discussed. Infant feeding: plans to breastfeed. Maternity leave: discussed. Cigarette smoking: never smoked. Orders Placed This Encounter  Procedures  . Strep B DNA probe  . POCT urinalysis dipstick   No orders of the defined types were placed in this encounter.   Follow up in 1 Week.

## 2014-12-21 LAB — STREP B DNA PROBE: STREP GROUP B AG: NOT DETECTED

## 2014-12-27 ENCOUNTER — Encounter: Payer: Self-pay | Admitting: Obstetrics

## 2014-12-27 ENCOUNTER — Ambulatory Visit (INDEPENDENT_AMBULATORY_CARE_PROVIDER_SITE_OTHER): Payer: Medicaid Other | Admitting: Obstetrics

## 2014-12-27 VITALS — BP 104/61 | HR 82 | Temp 98.2°F | Wt 182.0 lb

## 2014-12-27 DIAGNOSIS — Z3493 Encounter for supervision of normal pregnancy, unspecified, third trimester: Secondary | ICD-10-CM

## 2014-12-27 LAB — POCT URINALYSIS DIPSTICK
Bilirubin, UA: NEGATIVE
Blood, UA: NEGATIVE
Glucose, UA: NEGATIVE
KETONES UA: NEGATIVE
Leukocytes, UA: NEGATIVE
Nitrite, UA: NEGATIVE
PROTEIN UA: NEGATIVE
SPEC GRAV UA: 1.015
Urobilinogen, UA: NEGATIVE
pH, UA: 5

## 2014-12-27 NOTE — Progress Notes (Signed)
Subjective:    Caitlin MaserLucy [name] is a 34 y.o. female being seen today for her obstetrical visit. She is at [redacted]w[redacted]d gestation. Patient reports no complaints. Fetal movement: normal.  Problem List Items Addressed This Visit    None     There are no active problems to display for this patient.  Objective:    BP 104/61 mmHg  Pulse 82  Temp(Src) 98.2 F (36.8 C)  Wt 182 lb (82.555 kg)  LMP 04/13/2014 FHT:  150 BPM  Uterine Size: size equals dates  Presentation: unsure     Assessment:    Pregnancy @ 427w6d weeks   Plan:     labs reviewed, problem list updated Consent signed. GBS sent TDAP offered  Rhogam given for RH negative Pediatrician: discussed. Infant feeding: plans to breastfeed. Maternity leave: discussed. Cigarette smoking: never smoked. No orders of the defined types were placed in this encounter.   No orders of the defined types were placed in this encounter.   Follow up in 1 Week.

## 2015-01-03 ENCOUNTER — Encounter: Payer: Self-pay | Admitting: Obstetrics

## 2015-01-03 ENCOUNTER — Ambulatory Visit (INDEPENDENT_AMBULATORY_CARE_PROVIDER_SITE_OTHER): Payer: Medicaid Other | Admitting: Obstetrics

## 2015-01-03 VITALS — BP 104/61 | HR 89 | Temp 98.0°F | Wt 181.0 lb

## 2015-01-03 DIAGNOSIS — Z3483 Encounter for supervision of other normal pregnancy, third trimester: Secondary | ICD-10-CM

## 2015-01-03 LAB — POCT URINALYSIS DIPSTICK
BILIRUBIN UA: NEGATIVE
GLUCOSE UA: NEGATIVE
LEUKOCYTES UA: NEGATIVE
NITRITE UA: NEGATIVE
PH UA: 5.5
Spec Grav, UA: 1.02
Urobilinogen, UA: NEGATIVE

## 2015-01-03 NOTE — Progress Notes (Signed)
Subjective:    Caitlin MaserLucy Quiocho is a 34 y.o. female being seen today for her obstetrical visit. She is at 7883w6d gestation. Patient reports no complaints. Fetal movement: normal.  Problem List Items Addressed This Visit    None    Visit Diagnoses    Encounter for supervision of other normal pregnancy in third trimester    -  Primary    Relevant Orders    POCT urinalysis dipstick (Completed)      There are no active problems to display for this patient.   Objective:    BP 104/61 mmHg  Pulse 89  Temp(Src) 98 F (36.7 C)  Wt 181 lb (82.101 kg)  LMP 04/13/2014 FHT: 150 BPM  Uterine Size: size greater than dates  Presentations: cephalic    Assessment:    Pregnancy @ 5683w6d weeks   Plan:   Plans for delivery: Vaginal anticipated; labs reviewed; problem list updated Counseling: Consent signed. Infant feeding: plans to breastfeed. Cigarette smoking: never smoked. L&D discussion: symptoms of labor, discussed when to call, discussed what number to call, anesthetic/analgesic options reviewed and delivering clinician:  plans no preference. Postpartum supports and preparation: circumcision discussed and contraception plans discussed.  Follow up in 1 Week.

## 2015-01-08 ENCOUNTER — Telehealth (HOSPITAL_COMMUNITY): Payer: Self-pay | Admitting: *Deleted

## 2015-01-08 NOTE — Telephone Encounter (Signed)
Preadmission screen Interpreter number [phone]

## 2015-01-10 ENCOUNTER — Ambulatory Visit (INDEPENDENT_AMBULATORY_CARE_PROVIDER_SITE_OTHER): Payer: Medicaid Other | Admitting: Certified Nurse Midwife

## 2015-01-10 VITALS — BP 100/62 | HR 85 | Temp 98.5°F | Wt 184.0 lb

## 2015-01-10 DIAGNOSIS — Z3483 Encounter for supervision of other normal pregnancy, third trimester: Secondary | ICD-10-CM

## 2015-01-10 DIAGNOSIS — Z331 Pregnant state, incidental: Secondary | ICD-10-CM

## 2015-01-10 DIAGNOSIS — J069 Acute upper respiratory infection, unspecified: Secondary | ICD-10-CM

## 2015-01-10 DIAGNOSIS — J029 Acute pharyngitis, unspecified: Secondary | ICD-10-CM

## 2015-01-10 LAB — POCT URINALYSIS DIPSTICK
Bilirubin, UA: NEGATIVE
Glucose, UA: NEGATIVE
Ketones, UA: NEGATIVE
LEUKOCYTES UA: NEGATIVE
NITRITE UA: NEGATIVE
PH UA: 6
PROTEIN UA: NEGATIVE
RBC UA: NEGATIVE
Spec Grav, UA: 1.015
UROBILINOGEN UA: NEGATIVE

## 2015-01-10 LAB — POCT RAPID STREP A (OFFICE): RAPID STREP A SCREEN: NEGATIVE

## 2015-01-10 MED ORDER — PSEUDOEPHEDRINE-GUAIFENESIN ER 120-1200 MG PO TB12: 1.0000 | ORAL_TABLET | Freq: Two times a day (BID) | ORAL | Status: DC

## 2015-01-10 MED ORDER — AZITHROMYCIN 250 MG PO TABS: ORAL_TABLET | ORAL | Status: DC

## 2015-01-10 MED ORDER — GUAIFENESIN 100 MG/5ML PO SYRP: 200.0000 mg | ORAL_SOLUTION | Freq: Three times a day (TID) | ORAL | Status: DC | PRN

## 2015-01-10 NOTE — Progress Notes (Addendum)
Subjective:    Caitlin [name] is a 34 y.o. female being seen today for her obstetrical visit. She is at 6764w6d gestation. Patient reports backache, no bleeding, no contractions, no cramping and no leaking. States that she has had a cold and sore throat for two weeks, denies any fever or SOB, has not tried anything for it.  Fetal movement: normal.  Hx of large infants: largest was over 9lbs.    Problem List Items Addressed This Visit    None    Visit Diagnoses    Encounter for supervision of other normal pregnancy in third trimester    -  Primary    Relevant Orders    POCT urinalysis dipstick (Completed)    Sore throat        Relevant Orders    POCT rapid strep A (Completed)    URI, acute        Relevant Medications    Pseudoephedrine-Guaifenesin [phone] MG TB12    guaifenesin (ROBITUSSIN CHEST CONGESTION) 100 MG/5ML syrup    azithromycin (ZITHROMAX) 250 MG tablet      There are no active problems to display for this patient.   Objective:    BP 100/62 mmHg  Pulse 85  Temp(Src) 98.5 F (36.9 C)  Wt 184 lb (83.462 kg)  LMP 04/13/2014 FHT: 140 BPM  Uterine Size: 40 cm and size greater than dates  Presentations: cephalic  Pelvic Exam:              Dilation: Closed       Effacement: Long             Station:  -3    Consistency: soft            Position: posterior   EFW: 8 # by leopold's.    Lungs CTA bilaterally, sinuses slightly tender to palpation.  Assessment:    Pregnancy @ 2664w6d weeks   S>D  URI Plan:   Plans for delivery: Vaginal anticipated; labs reviewed; problem list updated Counseling: Consent signed. Infant feeding: plans to breastfeed. Cigarette smoking: never smoked. L&D discussion: symptoms of labor, discussed when to call, discussed what number to call, anesthetic/analgesic options reviewed and delivering clinician:  plans no preference. Postpartum supports and preparation: circumcision discussed and contraception plans discussed.  Follow up postpartum,  IOL scheduled for Tuesday the 27th of December, patient aware.

## 2015-01-14 ENCOUNTER — Other Ambulatory Visit: Payer: Self-pay | Admitting: Obstetrics

## 2015-01-15 ENCOUNTER — Inpatient Hospital Stay (HOSPITAL_COMMUNITY)
Admission: RE | Admit: 2015-01-15 | Discharge: 2015-01-16 | DRG: 782 | Disposition: A | Discharge status: Home / Self Care | Payer: Medicaid Other | Source: Ambulatory Visit | Attending: Obstetrics | Admitting: Obstetrics

## 2015-01-15 ENCOUNTER — Encounter (HOSPITAL_COMMUNITY): Payer: Self-pay

## 2015-01-15 DIAGNOSIS — Z3483 Encounter for supervision of other normal pregnancy, third trimester: Secondary | ICD-10-CM | POA: Diagnosis present

## 2015-01-15 DIAGNOSIS — Z3490 Encounter for supervision of normal pregnancy, unspecified, unspecified trimester: Secondary | ICD-10-CM

## 2015-01-15 LAB — TYPE AND SCREEN
ABO/RH(D): O POS
ANTIBODY SCREEN: NEGATIVE

## 2015-01-15 LAB — ABO/RH: ABO/RH(D): O POS

## 2015-01-15 LAB — CBC
HCT: 30.5 % — ABNORMAL LOW (ref 36.0–46.0)
HEMOGLOBIN: 10.3 g/dL — AB (ref 12.0–15.0)
MCH: 28.2 pg (ref 26.0–34.0)
MCHC: 33.8 g/dL (ref 30.0–36.0)
MCV: 83.6 fL (ref 78.0–100.0)
PLATELETS: 199 10*3/uL (ref 150–400)
RBC: 3.65 MIL/uL — AB (ref 3.87–5.11)
RDW: 13.1 % (ref 11.5–15.5)
WBC: 5.8 10*3/uL (ref 4.0–10.5)

## 2015-01-15 MED ORDER — ONDANSETRON HCL 4 MG/2ML IJ SOLN: 4.0000 mg | Freq: Four times a day (QID) | INTRAMUSCULAR | Status: DC | PRN

## 2015-01-15 MED ORDER — LIDOCAINE HCL (PF) 1 % IJ SOLN: 30.0000 mL | INTRAMUSCULAR | Status: DC | PRN

## 2015-01-15 MED ORDER — OXYCODONE-ACETAMINOPHEN 5-325 MG PO TABS: 2.0000 | ORAL_TABLET | ORAL | Status: DC | PRN

## 2015-01-15 MED ORDER — LACTATED RINGERS IV SOLN: 500.0000 mL | INTRAVENOUS | Status: DC | PRN

## 2015-01-15 MED ORDER — OXYTOCIN BOLUS FROM INFUSION: 500.0000 mL | INTRAVENOUS | Status: DC

## 2015-01-15 MED ORDER — ACETAMINOPHEN 325 MG PO TABS: 650.0000 mg | ORAL_TABLET | ORAL | Status: DC | PRN

## 2015-01-15 MED ORDER — OXYTOCIN 40 UNITS IN LACTATED RINGERS INFUSION - SIMPLE MED
2.0000 m[IU]/min | INTRAVENOUS | Status: DC
  Administered 2015-01-15: 2 m[IU]/min via INTRAVENOUS

## 2015-01-15 MED ORDER — FENTANYL CITRATE (PF) 100 MCG/2ML IJ SOLN
100.0000 ug | INTRAMUSCULAR | Status: DC | PRN
  Administered 2015-01-16: 100 ug via INTRAVENOUS
  Filled 2015-01-15: qty 2

## 2015-01-15 MED ORDER — NALBUPHINE HCL 10 MG/ML IJ SOLN
10.0000 mg | INTRAMUSCULAR | Status: DC | PRN
  Administered 2015-01-15: 10 mg via INTRAVENOUS
  Filled 2015-01-15: qty 1

## 2015-01-15 MED ORDER — CITRIC ACID-SODIUM CITRATE 334-500 MG/5ML PO SOLN: 30.0000 mL | ORAL | Status: DC | PRN

## 2015-01-15 MED ORDER — LACTATED RINGERS IV SOLN
INTRAVENOUS | Status: DC
  Administered 2015-01-15 – 2015-01-16 (×4): via INTRAVENOUS

## 2015-01-15 MED ORDER — TERBUTALINE SULFATE 1 MG/ML IJ SOLN
0.2500 mg | Freq: Once | INTRAMUSCULAR | Status: DC | PRN
Start: 2015-01-15 — End: 2015-01-16

## 2015-01-15 MED ORDER — MISOPROSTOL 25 MCG QUARTER TABLET
25.0000 ug | ORAL_TABLET | ORAL | Status: DC | PRN
  Administered 2015-01-15 – 2015-01-16 (×3): 25 ug via VAGINAL
  Filled 2015-01-15 (×3): qty 0.25

## 2015-01-15 MED ORDER — OXYCODONE-ACETAMINOPHEN 5-325 MG PO TABS: 1.0000 | ORAL_TABLET | ORAL | Status: DC | PRN

## 2015-01-15 MED ORDER — TERBUTALINE SULFATE 1 MG/ML IJ SOLN: 0.2500 mg | Freq: Once | INTRAMUSCULAR | Status: DC | PRN

## 2015-01-15 MED ORDER — OXYTOCIN 40 UNITS IN LACTATED RINGERS INFUSION - SIMPLE MED
62.5000 mL/h | INTRAVENOUS | Status: DC
  Filled 2015-01-15: qty 1000

## 2015-01-15 MED ORDER — NALBUPHINE HCL 10 MG/ML IJ SOLN
10.0000 mg | INTRAMUSCULAR | Status: DC | PRN
  Administered 2015-01-15: 10 mg via INTRAMUSCULAR
  Filled 2015-01-15: qty 1

## 2015-01-15 MED ORDER — OXYTOCIN 40 UNITS IN LACTATED RINGERS INFUSION - SIMPLE MED: 2.0000 m[IU]/min | INTRAVENOUS | Status: DC

## 2015-01-15 MED ORDER — PROMETHAZINE HCL 25 MG/ML IJ SOLN
25.0000 mg | Freq: Four times a day (QID) | INTRAMUSCULAR | Status: DC | PRN
  Administered 2015-01-15: 25 mg via INTRAMUSCULAR
  Filled 2015-01-15: qty 1

## 2015-01-15 NOTE — H&P (Signed)
Caitlin MaserLucy [name] is a 34 y.o. female presenting for elective IOL, multiparity. Maternal Medical History:  Reason for admission: IOL  Fetal activity: Perceived fetal activity is normal.   Last perceived fetal movement was within the past hour.    Prenatal complications: no prenatal complications Prenatal Complications - Diabetes: none.    OB History    Gravida Para Term Preterm AB TAB SAB Ectopic Multiple Living   4 3 3  0 0 0 0 0 0 3     Past Medical History  Diagnosis Date  . Anemia    Past Surgical History  Procedure Laterality Date  . No past surgeries     Family History: family history includes Diabetes in her paternal grandmother; Hearing loss in her maternal grandmother; Heart disease in her paternal grandmother; Hypertension in her paternal grandmother. There is no history of Asthma or Stroke. Social History:  reports that she has never smoked. She has never used smokeless tobacco. She reports that she does not drink alcohol or use illicit drugs.   Prenatal Transfer Tool  Maternal Diabetes: No Genetic Screening: Normal Maternal Ultrasounds/Referrals: Normal Fetal Ultrasounds or other Referrals:  None Maternal Substance Abuse:  No Significant Maternal Medications:  None Significant Maternal Lab Results:  None Other Comments:  None  Review of Systems  All other systems reviewed and are negative.     Last menstrual period 04/13/2014. Maternal Exam:  Abdomen: Patient reports no abdominal tenderness.   Physical Exam  Nursing note and vitals reviewed. Constitutional: She is oriented to person, place, and time. She appears well-developed and well-nourished.  HENT:  Head: Normocephalic and atraumatic.  Eyes: Conjunctivae are normal. Pupils are equal, round, and reactive to light.  Neck: Normal range of motion. Neck supple.  Cardiovascular: Normal rate and regular rhythm.   Respiratory: Effort normal and breath sounds normal.  GI: Soft. Bowel sounds are normal.   Musculoskeletal: Normal range of motion.  Neurological: She is alert and oriented to person, place, and time.  Skin: Skin is warm and dry.  Psychiatric: She has a normal mood and affect. Her behavior is normal. Judgment and thought content normal.    Prenatal labs: ABO, Rh: O/POS/-- (07/11 1344) Antibody: NEG (07/11 1344) Rubella: 15.50 (07/11 1344) RPR: NON REAC (10/07 1125)  HBsAg: NEGATIVE (07/11 1344)  HIV: NONREACTIVE (10/07 1125)  GBS: NOT DETECTED (12/01 1610)   Assessment/Plan: 39 weeks.  Elective IOL.   Caitlin [name] A 01/15/2015, 2:43 AM

## 2015-01-15 NOTE — Progress Notes (Signed)
Caitlin [name] is a 34 y.o. G4P3003 at 1759w4d by ultrasound admitted for induction of labor due to Elective at term., and in active labor started contracting around midnight.    Subjective: Tolerating contractions well.  Does not want epidural at this time.    Objective: BP 121/77 mmHg  Pulse 65  Temp(Src) 98.2 F (36.8 C) (Oral)  Resp 19  Ht 5\' 4"  (1.626 m)  Wt 183 lb (83.008 kg)  BMI 31.40 kg/m2  SpO2 100%  LMP 04/13/2014      FHT:  FHR: 145 bpm, variability: moderate,  accelerations:  Present,  decelerations:  Absent UC:   regular, every 2-3 minutes SVE:   Dilation: 1.5 Effacement (%): 60 Station: -2 Exam by:: Fransisco BeauKristy Lashley RN  Labs: Lab Results  Component Value Date   WBC 5.8 01/15/2015   HGB 10.3* 01/15/2015   HCT 30.5* 01/15/2015   MCV 83.6 01/15/2015   PLT 199 01/15/2015    Assessment / Plan: Augmentation of labor, progressing well  Labor: Progressing on Pitocin, will continue to increase then AROM Preeclampsia:  no signs or symptoms of toxicity Fetal Wellbeing:  Category I Pain Control:  Labor support without medications and at this time, PRN pain meds ordered.   I/D:  n/a Anticipated MOD:  NSVD  Roe CoombsRachelle A Hubbert Landrigan, CNM 01/15/2015, 8:38 AM

## 2015-01-16 ENCOUNTER — Inpatient Hospital Stay (HOSPITAL_COMMUNITY): Admission: RE | Admit: 2015-01-16 | Payer: Medicaid Other | Source: Ambulatory Visit

## 2015-01-16 LAB — RPR: RPR Ser Ql: NONREACTIVE

## 2015-01-16 MED ORDER — INFLUENZA VAC SPLIT QUAD 0.5 ML IM SUSY
0.5000 mL | PREFILLED_SYRINGE | INTRAMUSCULAR | Status: DC
Start: 2015-01-17 — End: 2015-01-16
  Filled 2015-01-16: qty 0.5

## 2015-01-16 MED ORDER — OXYTOCIN 40 UNITS IN LACTATED RINGERS INFUSION - SIMPLE MED: 2.0000 m[IU]/min | INTRAVENOUS | Status: DC

## 2015-01-16 NOTE — Discharge Instructions (Signed)
Induction scheduled Tuesday 1/3 at 0730

## 2015-01-16 NOTE — Discharge Summary (Signed)
Physician Discharge Summary  Patient ID: Caitlin MaserLucy [name] MRN: [id number] DOB/AGE: 07/14/1980 34 y.o.  Admit date: 01/15/2015 Discharge date: 01/16/2015  Admission Diagnoses:  39 weeks.  Induction of Labor  Discharge Diagnoses: Same.  Failed Induction.  Discharged home undelivered in good condition. Active Problems:   Pregnancy   Discharged Condition: good  Hospital Course: Admitted for 2 stage IOL.  No progress after 2 days.  EFM very reassuring.  Discussed lack of progress with patient and patient agreed to recommendation to stop induction and rest for 1 week, with resumption of IOL on 01-22-15.    Consults: None  Significant Diagnostic Studies: EFM  Treatments: IV hydration, analgesia: Nubain and Pitocin  Discharge Exam: Blood pressure 115/60, pulse 69, temperature 98.5 F (36.9 C), temperature source Oral, resp. rate 20, height 5\' 4"  (1.626 m), weight 183 lb (83.008 kg), last menstrual period 04/13/2014, SpO2 98 %. General appearance: alert and no distress Resp: clear to auscultation bilaterally Cardio: regular rate and rhythm, S1, S2 normal, no murmur, click, rub or gallop GI: normal findings: soft, non-tender Extremities: extremities normal, atraumatic, no cyanosis or edema  Disposition: 01-Home or Self Care  Discharge Instructions    Discharge activity:  No Restrictions    Complete by:  As directed      Discharge diet:  No restrictions    Complete by:  As directed      LABOR:  When conractions begin, you should start to time them from the beginning of one contraction to the beginning  of the next.  When contractions are 5 - 10 minutes apart or less and have been regular for at least an hour, you should call your health care provider.    Complete by:  As directed      Notify physician for bleeding from the vagina    Complete by:  As directed      Notify physician for blurring of vision or spots before the eyes    Complete by:  As directed      Notify physician for chills  or fever    Complete by:  As directed      Notify physician for fainting spells, "black outs" or loss of consciousness    Complete by:  As directed      Notify physician for increase in vaginal discharge    Complete by:  As directed      Notify physician for leaking of fluid    Complete by:  As directed      Notify physician for pain or burning when urinating    Complete by:  As directed      Notify physician for pelvic pressure (sudden increase)    Complete by:  As directed      Notify physician for severe or continued nausea or vomiting    Complete by:  As directed      Notify physician for sudden gushing of fluid from the vagina (with or without continued leaking)    Complete by:  As directed      Notify physician for sudden, constant, or occasional abdominal pain    Complete by:  As directed      Notify physician if baby moving less than usual    Complete by:  As directed             Medication List    STOP taking these medications        azithromycin 250 MG tablet  Commonly known as:  YQMVHQIONZITHROMAX  TAKE these medications        guaifenesin 100 MG/5ML syrup  Commonly known as:  ROBITUSSIN CHEST CONGESTION  Take 10 mLs (200 mg total) by mouth 3 (three) times daily as needed for cough.     OB COMPLETE PETITE 35-5-1-200 MG Caps  Take 1 capsule by mouth daily before breakfast.     Pseudoephedrine-Guaifenesin [phone] MG Tb12  Take 1 tablet by mouth every 12 (twelve) hours.           Follow-up Information    Follow up with [facility] [place]. Go on 01/22/2015.   Why:  Induction at 7:30 am   Contact information:   [address] [place] [address] [phone]      Follow up with [facility] [place] [facility]. Go on 01/22/2015.   Why:  Induction of Labor   Contact information:   [address] [id number] mc [place] [address] [phone]      Signed: Brock Bad 01/16/2015,  6:31 PM  39

## 2015-01-16 NOTE — OB Triage Note (Signed)
Per verbal telephone request from Dr Clearance CootsHarper, patient discharged to home, ambulatory, with husband and three older children. Reassuring Fetal tracing, and no cervical change this shift. Kyrene Longan [place] Rick [place], [place] 01/16/2015 5:16 PM

## 2015-01-16 NOTE — Progress Notes (Signed)
RN answered call from Dr Clearance CootsHarper regarding patient status. Per verbal order, will stop pitocin and discharge patient home. Per Dr Clearance CootsHarper, it is not necessary to re-check cervix, however, patient requested cervical check. No change in cervical exam. See documented measurements. Patient accepting and verbalizes understanding. Caitlin [name] [place] Jaliya Siegmann, [place] 01/16/2015 5:12 PM

## 2015-01-16 NOTE — Progress Notes (Signed)
Kathaleen MaserLucy Flett is a 34 y.o. G4P3003 at 6329w5d by LMP admitted for induction of labor due to Elective at term.  Subjective:   Objective: BP 115/60 mmHg  Pulse 69  Temp(Src) 98.5 F (36.9 C) (Oral)  Resp 20  Ht 5\' 4"  (1.626 m)  Wt 183 lb (83.008 kg)  BMI 31.40 kg/m2  SpO2 98%  LMP 04/13/2014      FHT:  FHR: 140 bpm, variability: moderate,  accelerations:  Present,  decelerations:  Absent UC:   irregular, every 3-5 minutes SVE:   Dilation: 6 Effacement (%): 50 Station: -3 Exam by::  Sydnee Levans([place], RN per patient request)  Labs: Lab Results  Component Value Date   WBC 5.8 01/15/2015   HGB 10.3* 01/15/2015   HCT 30.5* 01/15/2015   MCV 83.6 01/15/2015   PLT 199 01/15/2015    Assessment / Plan: Induction of labor due to elective at term,  progressing well on pitocin  Labor: No progress Preeclampsia:  n/a Fetal Wellbeing:  Category I Pain Control:  Labor support without medications I/D:  n/a Anticipated MOD:  NSVD  HARPER,CHARLES A 01/16/2015, 6:26 PM

## 2015-01-16 NOTE — Progress Notes (Signed)
Kathaleen MaserLucy Marsicano is a 34 y.o. G4P3003 at 557w5d by ultrasound admitted for induction of labor due to Elective at term.  Subjective: Doing well.  Would like to eat. Got some sleep overnight.  No complaints.  Objective: BP 117/65 mmHg  Pulse 67  Temp(Src) 98.7 F (37.1 C) (Oral)  Resp 20  Ht 5\' 4"  (1.626 m)  Wt 183 lb (83.008 kg)  BMI 31.40 kg/m2  SpO2 98%  LMP 04/13/2014      FHT:  FHR: 125 bpm, variability: moderate,  accelerations:  Present,  decelerations:  Absent UC:   irregular, every 3-6 minutes SVE:   Dilation: 5 Effacement (%): 50 Station: -2 Exam by:: D Jasso, RN  Labs: Lab Results  Component Value Date   WBC 5.8 01/15/2015   HGB 10.3* 01/15/2015   HCT 30.5* 01/15/2015   MCV 83.6 01/15/2015   PLT 199 01/15/2015    Assessment / Plan: Protracted latent phase Cytotec overnight.  Start pitocin this AM, increase and then AROM if needed.   Labor: Progressing normally Preeclampsia:  no signs or symptoms of toxicity Fetal Wellbeing:  Category I Pain Control:  Labor support without medications, IVP pain medications ordered if needed.   I/D:  n/a Anticipated MOD:  NSVD  Julina Altmann A Cannie Muckle 01/16/2015, 7:55 AM

## 2015-01-16 NOTE — Progress Notes (Signed)
RN spoke with Dr Clearance CootsHarper via phone. Per his verbal order, will not increase Pitocin any further. He will recheck patient at 6pm. If no further progress, may then send patient home. Will continue to assess and notify MD if necessary. Felisha Claytor [place] Shanty Ginty, [place]RN 01/16/2015 1:55 PM

## 2015-01-17 ENCOUNTER — Encounter: Payer: Medicaid Other | Admitting: Obstetrics

## 2015-01-20 NOTE — L&D Delivery Note (Signed)
Delivery Note  This is a 35 year old G 4 P3 who was admitted for Not in labor. and IOL for postdates. She progressed with cytotec and epidural to the second stage of labor.  She pushed for 10 min.  She had a slight shoulder dystocia, McRoberts performed.  She delivered a viable infant female, cephalic, over an intact perineum.  A nuchal cord   was not identified. Infant placed on maternal abdomen. Cord double clamped and cut.  Infant taken to warmer for assessment.  Apgar scores were 5 and 9. The placenta delivered spontaneously, shultz, with a 3 vessel cord.  Inspection revealed none. Cytotec PR 1000 mg given.  The uterus was firm bleeding stable.  EBL was 500.    Placenta and umbilical artery blood gas were not sent.  There were no complications during the procedure.  Mom and baby skin to skin following delivery. Left in stable condition.       Caitlin [name], Caitlin [name] [[id number]][[id number]]  Delivery Note At 3:43 AM a viable female was delivered via Vaginal, Spontaneous Delivery (Presentation: Right Occiput Anterior).  APGAR: 5, 9; weight 9 lb 14.6 oz (4495 g).   Placenta status: shultz, spontaneous.  Cord: 3 vessels with the following complications: None.  Cord pH: N/A  Anesthesia: Epidural  Episiotomy: None Lacerations: None Suture Repair: none Est. Blood Loss (mL):  500 ml  Mom to postpartum.  Baby to Couplet care / Skin to Skin.  Roe Coombsachelle A Jacobe Study, CNM 01/23/2015, 4:12 AM

## 2015-01-21 ENCOUNTER — Encounter (HOSPITAL_COMMUNITY): Payer: Self-pay | Admitting: *Deleted

## 2015-01-21 ENCOUNTER — Inpatient Hospital Stay (EMERGENCY_DEPARTMENT_HOSPITAL)
Admission: AD | Admit: 2015-01-21 | Discharge: 2015-01-21 | Disposition: A | Discharge status: Home / Self Care | Payer: Medicaid Other | Source: Ambulatory Visit | Attending: Obstetrics | Admitting: Obstetrics

## 2015-01-21 DIAGNOSIS — Z3A40 40 weeks gestation of pregnancy: Secondary | ICD-10-CM

## 2015-01-21 DIAGNOSIS — R600 Localized edema: Secondary | ICD-10-CM

## 2015-01-21 DIAGNOSIS — O1203 Gestational edema, third trimester: Secondary | ICD-10-CM | POA: Diagnosis not present

## 2015-01-21 LAB — URINALYSIS, ROUTINE W REFLEX MICROSCOPIC
Bilirubin Urine: NEGATIVE
GLUCOSE, UA: NEGATIVE mg/dL
HGB URINE DIPSTICK: NEGATIVE
Ketones, ur: NEGATIVE mg/dL
Leukocytes, UA: NEGATIVE
Nitrite: NEGATIVE
Protein, ur: NEGATIVE mg/dL
pH: 6 (ref 5.0–8.0)

## 2015-01-21 NOTE — MAU Note (Signed)
Pt states her feet have been really swollen x 3 days. Also reports vaginal pressure.

## 2015-01-21 NOTE — MAU Provider Note (Signed)
History     CSN: [id number]  Arrival date and time: 01/21/15 0102   First Provider Initiated Contact with Patient 01/21/15 0135      Chief Complaint  Patient presents with  . Leg Swelling   HPI Comments: Caitlin [name] is a 35 y.o. G4P3003 at 5066w3d who presents today with leg edema. She states that it has been there about two days. She states that it is the same in each leg, and goes to just above the knee. She denies an redness.  Of note, she was here and sent home for failed induction. She had IV fluids and pitocin at that time. She states that she is scheduled for IOL on 01/21/14. She has had contractions and pressure off and on. She denies any VB or LOF. She states that the baby has been moving normally. She denies any headache, visual disturbances or RUQ pain.    Past Medical History  Diagnosis Date  . Anemia     Past Surgical History  Procedure Laterality Date  . No past surgeries      Family History  Problem Relation Age of Onset  . Hypertension Paternal Grandmother   . Diabetes Paternal Grandmother   . Heart disease Paternal Grandmother   . Hearing loss Maternal Grandmother     with age  . Asthma Neg Hx   . Stroke Neg Hx     Social History  Substance Use Topics  . Smoking status: Never Smoker   . Smokeless tobacco: Never Used  . Alcohol Use: No    Allergies: No Known Allergies  Prescriptions prior to admission  Medication Sig Dispense Refill Last Dose  . guaifenesin (ROBITUSSIN CHEST CONGESTION) 100 MG/5ML syrup Take 10 mLs (200 mg total) by mouth 3 (three) times daily as needed for cough. 120 mL 0 01/12/2015  . Prenat-FeCbn-FeAspGl-FA-Omega (OB COMPLETE PETITE) 35-5-1-200 MG CAPS Take 1 capsule by mouth daily before breakfast. 90 capsule 3 01/14/2015 at Unknown time  . Pseudoephedrine-Guaifenesin [phone] MG TB12 Take 1 tablet by mouth every 12 (twelve) hours. 28 each 0 01/15/2015 at Unknown time    ROS Physical Exam   Blood pressure 118/63, pulse 75,  temperature 98.2 F (36.8 C), temperature source Oral, resp. rate 18, height 5\' 3"  (1.6 m), weight 88.905 kg (196 lb), last menstrual period 04/13/2014, SpO2 98 %.  Physical Exam  Nursing note and vitals reviewed. Constitutional: She is oriented to person, place, and time. She appears well-developed and well-nourished. No distress.  HENT:  Head: Normocephalic.  Cardiovascular: Normal rate.   Respiratory: Effort normal.  GI: Soft. There is no tenderness. There is no rebound.  Genitourinary:  Cervix: 4/80/-2   Musculoskeletal: Normal range of motion. She exhibits edema (2+ pitting edema of the foot and ankle. Non-pitting edema of the calves. ). She exhibits no tenderness.  Neurological: She is alert and oriented to person, place, and time.  Skin: Skin is warm and dry.  Psychiatric: She has a normal mood and affect.    FHT: 125, moderate with 15x15 accels, no decels Toco: no UCs  MAU Course  Procedures  MDM   Assessment and Plan   1. Bilateral leg edema   2. [redacted] weeks gestation of pregnancy    DC home Comfort measures reviewed  3rd Trimester precautions  Labor precautions  Fetal kick counts RX: none  Return to MAU as needed FU with OB as planned  Follow-up Information    Follow up with HARPER,CHARLES A, MD.   Specialty:  Obstetrics  and Gynecology   Why:  As scheduled   Contact information:   [address] [place] [address] [phone]         Tawnya Crook 01/21/2015, 1:37 AM

## 2015-01-21 NOTE — Discharge Instructions (Signed)

## 2015-01-22 ENCOUNTER — Inpatient Hospital Stay (HOSPITAL_COMMUNITY): Payer: Medicaid Other | Admitting: Anesthesiology

## 2015-01-22 ENCOUNTER — Inpatient Hospital Stay (HOSPITAL_COMMUNITY)
Admission: AD | Admit: 2015-01-22 | Discharge: 2015-01-25 | DRG: 775 | Disposition: A | Discharge status: Home / Self Care | Payer: Medicaid Other | Source: Ambulatory Visit | Attending: Obstetrics | Admitting: Obstetrics

## 2015-01-22 ENCOUNTER — Encounter (HOSPITAL_COMMUNITY): Payer: Self-pay

## 2015-01-22 DIAGNOSIS — Z3A40 40 weeks gestation of pregnancy: Secondary | ICD-10-CM

## 2015-01-22 DIAGNOSIS — Z833 Family history of diabetes mellitus: Secondary | ICD-10-CM | POA: Diagnosis not present

## 2015-01-22 DIAGNOSIS — O9902 Anemia complicating childbirth: Secondary | ICD-10-CM | POA: Diagnosis present

## 2015-01-22 DIAGNOSIS — Z8249 Family history of ischemic heart disease and other diseases of the circulatory system: Secondary | ICD-10-CM | POA: Diagnosis not present

## 2015-01-22 DIAGNOSIS — Z822 Family history of deafness and hearing loss: Secondary | ICD-10-CM | POA: Diagnosis not present

## 2015-01-22 DIAGNOSIS — Z6834 Body mass index (BMI) 34.0-34.9, adult: Secondary | ICD-10-CM | POA: Diagnosis not present

## 2015-01-22 DIAGNOSIS — E669 Obesity, unspecified: Secondary | ICD-10-CM | POA: Diagnosis present

## 2015-01-22 DIAGNOSIS — O99214 Obesity complicating childbirth: Secondary | ICD-10-CM | POA: Diagnosis present

## 2015-01-22 DIAGNOSIS — D649 Anemia, unspecified: Secondary | ICD-10-CM | POA: Diagnosis present

## 2015-01-22 DIAGNOSIS — O480 Post-term pregnancy: Secondary | ICD-10-CM | POA: Diagnosis present

## 2015-01-22 DIAGNOSIS — Z3490 Encounter for supervision of normal pregnancy, unspecified, unspecified trimester: Secondary | ICD-10-CM

## 2015-01-22 DIAGNOSIS — O1203 Gestational edema, third trimester: Secondary | ICD-10-CM | POA: Diagnosis not present

## 2015-01-22 LAB — CBC
HEMATOCRIT: 29.2 % — AB (ref 36.0–46.0)
HEMOGLOBIN: 9.7 g/dL — AB (ref 12.0–15.0)
MCH: 27.9 pg (ref 26.0–34.0)
MCHC: 33.2 g/dL (ref 30.0–36.0)
MCV: 83.9 fL (ref 78.0–100.0)
Platelets: 179 10*3/uL (ref 150–400)
RBC: 3.48 MIL/uL — AB (ref 3.87–5.11)
RDW: 13.3 % (ref 11.5–15.5)
WBC: 5.2 10*3/uL (ref 4.0–10.5)

## 2015-01-22 LAB — RPR: RPR: NONREACTIVE

## 2015-01-22 LAB — TYPE AND SCREEN
ABO/RH(D): O POS
Antibody Screen: NEGATIVE

## 2015-01-22 MED ORDER — ONDANSETRON HCL 4 MG/2ML IJ SOLN: 4.0000 mg | Freq: Four times a day (QID) | INTRAMUSCULAR | Status: DC | PRN

## 2015-01-22 MED ORDER — EPHEDRINE 5 MG/ML INJ
10.0000 mg | INTRAVENOUS | Status: DC | PRN
  Filled 2015-01-22: qty 2

## 2015-01-22 MED ORDER — PROMETHAZINE HCL 25 MG/ML IJ SOLN
25.0000 mg | Freq: Four times a day (QID) | INTRAMUSCULAR | Status: DC | PRN
  Administered 2015-01-22: 25 mg via INTRAMUSCULAR
  Filled 2015-01-22: qty 1

## 2015-01-22 MED ORDER — OXYTOCIN BOLUS FROM INFUSION
500.0000 mL | INTRAVENOUS | Status: DC
  Administered 2015-01-23: 500 mL via INTRAVENOUS

## 2015-01-22 MED ORDER — FLEET ENEMA 7-19 GM/118ML RE ENEM: 1.0000 | ENEMA | RECTAL | Status: DC | PRN

## 2015-01-22 MED ORDER — LIDOCAINE HCL (PF) 1 % IJ SOLN
30.0000 mL | INTRAMUSCULAR | Status: DC | PRN
  Filled 2015-01-22: qty 30

## 2015-01-22 MED ORDER — PHENYLEPHRINE 40 MCG/ML (10ML) SYRINGE FOR IV PUSH (FOR BLOOD PRESSURE SUPPORT)
80.0000 ug | PREFILLED_SYRINGE | INTRAVENOUS | Status: DC | PRN
  Filled 2015-01-22: qty 2
  Filled 2015-01-22: qty 20

## 2015-01-22 MED ORDER — MISOPROSTOL 200 MCG PO TABS
50.0000 ug | ORAL_TABLET | ORAL | Status: DC
  Administered 2015-01-22 (×2): 50 ug via ORAL
  Filled 2015-01-22 (×2): qty 0.5

## 2015-01-22 MED ORDER — NALBUPHINE HCL 10 MG/ML IJ SOLN
10.0000 mg | INTRAMUSCULAR | Status: DC | PRN
  Administered 2015-01-22: 10 mg via INTRAMUSCULAR
  Filled 2015-01-22: qty 1

## 2015-01-22 MED ORDER — TERBUTALINE SULFATE 1 MG/ML IJ SOLN
0.2500 mg | Freq: Once | INTRAMUSCULAR | Status: DC | PRN
  Filled 2015-01-22: qty 1

## 2015-01-22 MED ORDER — NALBUPHINE HCL 10 MG/ML IJ SOLN
10.0000 mg | INTRAMUSCULAR | Status: DC | PRN
  Administered 2015-01-22: 10 mg via INTRAVENOUS
  Filled 2015-01-22: qty 1

## 2015-01-22 MED ORDER — LACTATED RINGERS IV SOLN
INTRAVENOUS | Status: DC
  Administered 2015-01-22 (×3): via INTRAVENOUS

## 2015-01-22 MED ORDER — FENTANYL CITRATE (PF) 100 MCG/2ML IJ SOLN
100.0000 ug | INTRAMUSCULAR | Status: DC | PRN
  Administered 2015-01-22: 100 ug via INTRAVENOUS
  Filled 2015-01-22: qty 2

## 2015-01-22 MED ORDER — DIPHENHYDRAMINE HCL 50 MG/ML IJ SOLN: 12.5000 mg | INTRAMUSCULAR | Status: DC | PRN

## 2015-01-22 MED ORDER — OXYCODONE-ACETAMINOPHEN 5-325 MG PO TABS
2.0000 | ORAL_TABLET | ORAL | Status: DC | PRN
Start: 2015-01-22 — End: 2015-01-23

## 2015-01-22 MED ORDER — OXYCODONE-ACETAMINOPHEN 5-325 MG PO TABS: 1.0000 | ORAL_TABLET | ORAL | Status: DC | PRN

## 2015-01-22 MED ORDER — OXYTOCIN 40 UNITS IN LACTATED RINGERS INFUSION - SIMPLE MED: 62.5000 mL/h | INTRAVENOUS | Status: DC

## 2015-01-22 MED ORDER — LIDOCAINE HCL (PF) 1 % IJ SOLN
INTRAMUSCULAR | Status: DC | PRN
  Administered 2015-01-22 (×2): 4 mL

## 2015-01-22 MED ORDER — FENTANYL 2.5 MCG/ML BUPIVACAINE 1/10 % EPIDURAL INFUSION (WH - ANES): 14.0000 mL/h | INTRAMUSCULAR | Status: DC | PRN

## 2015-01-22 MED ORDER — ACETAMINOPHEN 325 MG PO TABS: 650.0000 mg | ORAL_TABLET | ORAL | Status: DC | PRN

## 2015-01-22 MED ORDER — FENTANYL 2.5 MCG/ML BUPIVACAINE 1/10 % EPIDURAL INFUSION (WH - ANES)
14.0000 mL/h | INTRAMUSCULAR | Status: DC | PRN
  Administered 2015-01-22 – 2015-01-23 (×2): 14 mL/h via EPIDURAL
  Filled 2015-01-22 (×2): qty 125

## 2015-01-22 MED ORDER — LACTATED RINGERS IV SOLN: 500.0000 mL | INTRAVENOUS | Status: DC | PRN

## 2015-01-22 MED ORDER — CITRIC ACID-SODIUM CITRATE 334-500 MG/5ML PO SOLN: 30.0000 mL | ORAL | Status: DC | PRN

## 2015-01-22 NOTE — Anesthesia Preprocedure Evaluation (Signed)
Anesthesia Evaluation  Patient identified by MRN, date of birth, ID band Patient awake    Reviewed: Allergy & Precautions, NPO status , Patient's Chart, lab work & pertinent test results  Airway Mallampati: II  TM Distance: >3 FB Neck ROM: Full    Dental no notable dental hx.    Pulmonary neg pulmonary ROS,    Pulmonary exam normal breath sounds clear to auscultation       Cardiovascular negative cardio ROS Normal cardiovascular exam Rhythm:Regular Rate:Normal     Neuro/Psych negative neurological ROS  negative psych ROS   GI/Hepatic negative GI ROS, Neg liver ROS,   Endo/Other  negative endocrine ROS  Renal/GU negative Renal ROS     Musculoskeletal negative musculoskeletal ROS (+)   Abdominal (+) + obese,   Peds  Hematology  (+) anemia ,   Anesthesia Other Findings   Reproductive/Obstetrics (+) Pregnancy                             Anesthesia Physical Anesthesia Plan  ASA: III  Anesthesia Plan: Epidural   Post-op Pain Management:    Induction:   Airway Management Planned:   Additional Equipment:   Intra-op Plan:   Post-operative Plan:   Informed Consent: I have reviewed the patients History and Physical, chart, labs and discussed the procedure including the risks, benefits and alternatives for the proposed anesthesia with the patient or authorized representative who has indicated his/her understanding and acceptance.     Plan Discussed with:   Anesthesia Plan Comments:         Anesthesia Quick Evaluation

## 2015-01-22 NOTE — Anesthesia Procedure Notes (Signed)
Epidural Patient location during procedure: OB  Staffing Anesthesiologist: Lewie LoronGERMEROTH, Tyronica Truxillo Performed by: anesthesiologist   Preanesthetic Checklist Completed: patient identified, pre-op evaluation, timeout performed, IV checked, risks and benefits discussed and monitors and equipment checked  Epidural Patient position: sitting Prep: site prepped and draped and DuraPrep Patient monitoring: heart rate Approach: midline Location: L3-L4 Injection technique: LOR air and LOR saline  Needle:  Needle type: Tuohy  Needle gauge: 17 G Needle length: 9 cm Needle insertion depth: 8 cm Catheter type: closed end flexible Catheter size: 19 Gauge Catheter at skin depth: 13 cm Test dose: negative  Assessment Sensory level: T8 Events: blood not aspirated, injection not painful, no injection resistance, negative IV test and no paresthesia  Additional Notes Reason for block:procedure for pain

## 2015-01-22 NOTE — H&P (Signed)
Caitlin [name] is a 35 y.o. female presenting for IOL for postdates. History OB History    Gravida Para Term Preterm AB TAB SAB Ectopic Multiple Living   4 3 3  0 0 0 0 0 0 3     Past Medical History  Diagnosis Date  . Anemia    Past Surgical History  Procedure Laterality Date  . No past surgeries     Family History: family history includes Diabetes in her paternal grandmother; Hearing loss in her maternal grandmother; Heart disease in her paternal grandmother; Hypertension in her paternal grandmother. There is no history of Asthma or Stroke. Social History:  reports that she has never smoked. She has never used smokeless tobacco. She reports that she does not drink alcohol or use illicit drugs.   Prenatal Transfer Tool  Maternal Diabetes: No Genetic Screening: Normal Maternal Ultrasounds/Referrals: Normal Fetal Ultrasounds or other Referrals:  None Maternal Substance Abuse:  No Significant Maternal Medications:  None Significant Maternal Lab Results:  None Other Comments:  None  ROS  Dilation: 3 Effacement (%): 50 Station: -2 Exam by:: Orvilla Cornwallachelle Denney, CNM Blood pressure 112/74, pulse 78, temperature 98.3 F (36.8 C), temperature source Oral, resp. rate 20, height 5\' 3"  (1.6 m), weight 196 lb (88.905 kg), last menstrual period 04/13/2014. Exam Physical Exam  Prenatal labs: ABO, Rh: --/--/O POS, O POS (12/27 0430) Antibody: NEG (12/27 0430) Rubella: 15.50 (07/11 1344) RPR: Non Reactive (12/27 0430)  HBsAg: NEGATIVE (07/11 1344)  HIV: NONREACTIVE (10/07 1125)  GBS: NOT DETECTED (12/01 1610)   Assessment/Plan: IOL for postdates management.  Cytotec then progress to Pitocin and AROM.  Patient requests IVP pain medications and later epidural.   Rachelle A Denney 01/22/2015, 10:46 AM

## 2015-01-23 ENCOUNTER — Encounter (HOSPITAL_COMMUNITY): Payer: Self-pay

## 2015-01-23 LAB — CCBB MATERNAL DONOR DRAW

## 2015-01-23 LAB — CBC
HEMATOCRIT: 21.8 % — AB (ref 36.0–46.0)
HEMOGLOBIN: 7.5 g/dL — AB (ref 12.0–15.0)
MCH: 28.7 pg (ref 26.0–34.0)
MCHC: 34.4 g/dL (ref 30.0–36.0)
MCV: 83.5 fL (ref 78.0–100.0)
Platelets: 148 10*3/uL — ABNORMAL LOW (ref 150–400)
RBC: 2.61 MIL/uL — AB (ref 3.87–5.11)
RDW: 13.3 % (ref 11.5–15.5)
WBC: 12.2 10*3/uL — AB (ref 4.0–10.5)

## 2015-01-23 MED ORDER — DIPHENHYDRAMINE HCL 25 MG PO CAPS: 25.0000 mg | ORAL_CAPSULE | Freq: Four times a day (QID) | ORAL | Status: DC | PRN

## 2015-01-23 MED ORDER — ONDANSETRON HCL 4 MG PO TABS: 4.0000 mg | ORAL_TABLET | ORAL | Status: DC | PRN

## 2015-01-23 MED ORDER — SENNOSIDES-DOCUSATE SODIUM 8.6-50 MG PO TABS
2.0000 | ORAL_TABLET | ORAL | Status: DC
  Administered 2015-01-23 – 2015-01-24 (×2): 2 via ORAL
  Filled 2015-01-23 (×2): qty 2

## 2015-01-23 MED ORDER — LACTATED RINGERS IV SOLN
1.0000 m[IU]/min | INTRAVENOUS | Status: DC
  Filled 2015-01-23: qty 4

## 2015-01-23 MED ORDER — DIBUCAINE 1 % RE OINT
1.0000 | TOPICAL_OINTMENT | RECTAL | Status: DC | PRN
Start: 2015-01-23 — End: 2015-01-25

## 2015-01-23 MED ORDER — OXYCODONE-ACETAMINOPHEN 5-325 MG PO TABS: 1.0000 | ORAL_TABLET | ORAL | Status: DC | PRN

## 2015-01-23 MED ORDER — METHYLERGONOVINE MALEATE 0.2 MG/ML IJ SOLN: 0.2000 mg | INTRAMUSCULAR | Status: DC | PRN

## 2015-01-23 MED ORDER — MISOPROSTOL 200 MCG PO TABS
ORAL_TABLET | ORAL | Status: AC
  Administered 2015-01-23: 800 ug
  Filled 2015-01-23: qty 5

## 2015-01-23 MED ORDER — TETANUS-DIPHTH-ACELL PERTUSSIS 5-2.5-18.5 LF-MCG/0.5 IM SUSP
0.5000 mL | Freq: Once | INTRAMUSCULAR | Status: AC
  Administered 2015-01-24: 0.5 mL via INTRAMUSCULAR
  Filled 2015-01-23: qty 0.5

## 2015-01-23 MED ORDER — OB COMPLETE PETITE 35-5-1-200 MG PO CAPS: 1.0000 | ORAL_CAPSULE | Freq: Every day | ORAL | Status: DC

## 2015-01-23 MED ORDER — ZOLPIDEM TARTRATE 5 MG PO TABS: 5.0000 mg | ORAL_TABLET | Freq: Every evening | ORAL | Status: DC | PRN

## 2015-01-23 MED ORDER — METHYLERGONOVINE MALEATE 0.2 MG PO TABS: 0.2000 mg | ORAL_TABLET | ORAL | Status: DC | PRN

## 2015-01-23 MED ORDER — SIMETHICONE 80 MG PO CHEW: 80.0000 mg | CHEWABLE_TABLET | ORAL | Status: DC | PRN

## 2015-01-23 MED ORDER — MAGNESIUM HYDROXIDE 400 MG/5ML PO SUSP: 30.0000 mL | ORAL | Status: DC | PRN

## 2015-01-23 MED ORDER — LANOLIN HYDROUS EX OINT: TOPICAL_OINTMENT | CUTANEOUS | Status: DC | PRN

## 2015-01-23 MED ORDER — OXYTOCIN 10 UNIT/ML IJ SOLN
2.5000 [IU]/h | INTRAVENOUS | Status: DC
  Administered 2015-01-23: 2.5 [IU]/h via INTRAVENOUS
  Filled 2015-01-23: qty 4

## 2015-01-23 MED ORDER — BENZOCAINE-MENTHOL 20-0.5 % EX AERO
1.0000 "application " | INHALATION_SPRAY | CUTANEOUS | Status: DC | PRN
  Administered 2015-01-23: 1 via TOPICAL
  Filled 2015-01-23: qty 56

## 2015-01-23 MED ORDER — ACETAMINOPHEN 325 MG PO TABS: 650.0000 mg | ORAL_TABLET | ORAL | Status: DC | PRN

## 2015-01-23 MED ORDER — IBUPROFEN 600 MG PO TABS
600.0000 mg | ORAL_TABLET | Freq: Four times a day (QID) | ORAL | Status: DC
  Administered 2015-01-23 – 2015-01-25 (×10): 600 mg via ORAL
  Filled 2015-01-23 (×10): qty 1

## 2015-01-23 MED ORDER — WITCH HAZEL-GLYCERIN EX PADS
1.0000 | MEDICATED_PAD | CUTANEOUS | Status: DC | PRN
Start: 2015-01-23 — End: 2015-01-25

## 2015-01-23 MED ORDER — ONDANSETRON HCL 4 MG/2ML IJ SOLN: 4.0000 mg | INTRAMUSCULAR | Status: DC | PRN

## 2015-01-23 MED ORDER — FERROUS SULFATE 325 (65 FE) MG PO TABS
325.0000 mg | ORAL_TABLET | Freq: Two times a day (BID) | ORAL | Status: DC
  Administered 2015-01-23 – 2015-01-25 (×5): 325 mg via ORAL
  Filled 2015-01-23 (×5): qty 1

## 2015-01-23 MED ORDER — PRENATAL MULTIVITAMIN CH
1.0000 | ORAL_TABLET | Freq: Every day | ORAL | Status: DC
  Administered 2015-01-23 – 2015-01-25 (×3): 1 via ORAL
  Filled 2015-01-23 (×3): qty 1

## 2015-01-23 MED ORDER — LACTATED RINGERS IV SOLN
INTRAVENOUS | Status: DC
  Administered 2015-01-23: 01:00:00 via INTRAUTERINE

## 2015-01-23 MED ORDER — OXYCODONE-ACETAMINOPHEN 5-325 MG PO TABS: 2.0000 | ORAL_TABLET | ORAL | Status: DC | PRN

## 2015-01-23 NOTE — Anesthesia Postprocedure Evaluation (Signed)
Anesthesia Post Note  Patient: Caitlin [name]  Procedure(s) Performed: * No procedures listed *  Patient location during evaluation: Mother Baby Anesthesia Type: Epidural Level of consciousness: awake Pain management: pain level controlled Vital Signs Assessment: post-procedure vital signs reviewed and stable Respiratory status: spontaneous breathing Cardiovascular status: stable Postop Assessment: no headache, no backache, epidural receding, patient able to bend at knees, no signs of nausea or vomiting and adequate PO intake Anesthetic complications: no    Last Vitals:  Filed Vitals:   01/23/15 0750 01/23/15 1145  BP: 110/61 107/56  Pulse: 85 91  Temp: 37.3 C 36.8 C  Resp: 18 16    Last Pain:  Filed Vitals:   01/23/15 1224  PainSc: 5                  Euna Armon

## 2015-01-23 NOTE — Lactation Note (Signed)
This note was copied from the chart of Caitlin Kathaleen MaserLucy Besaw. Lactation Consultation Note Experienced BF mom BF her 1st child for 3 months, her 2nd child for 6 months, her 3rd child for 2 yrs. Mom has cone shaped breast, full feeling and tender to touch. Mom has generalized edema and 2+ pit edema to LE. I feel that mom has edema in breast as well. This baby is 9 lb 14 oz. Has shoulder dystocia and bruised forehead. Discussed w/mom jaundice, BF, I&O, supply and demand. Mom is breast and bottle. Discussed formula cuts milk supply. Mom feels that baby needs formula until her milk comes in until she sees milk or colostrum. D/t edema breast tender and can't tolerate hand expression. I hand expressed easy, unable to see colostrum. Mom encouraged to feed baby 8-12 times/24 hours and with feeding cues. Mom encouraged to do skin-to-skin. Ecated about newborn behavior, I&O, breast massage, engorgement prevention, pacifiers, supply and demand. Referred to Baby and Me Book in Breastfeeding section Pg. 22-23 for position options and Proper latch demonstration.[facility]/LC brochure given w/resources, support groups and LC services. Patient Name: Caitlin [name]: 01/23/2015 Reason for consult: Initial assessment   Maternal Data Has patient been taught Hand Expression?: Yes Does the patient have breastfeeding experience prior to this delivery?: Yes  Feeding Feeding Type: Breast Fed Length of feed: 0 min (baby would not latch on)  LATCH Score/Interventions Latch: Grasps breast easily, tongue down, lips flanged, rhythmical sucking.  Audible Swallowing: Spontaneous and intermittent Intervention(s): Skin to skin  Type of Nipple: Everted at rest and after stimulation  Comfort (Breast/Nipple): Soft / non-tender     Hold (Positioning): Assistance needed to correctly position infant at breast and maintain latch. Intervention(s): Skin to skin;Position options;Support Pillows;Breastfeeding basics  reviewed  LATCH Score: 9  Lactation Tools Discussed/Used WIC Program: Yes   Consult Status Consult Status: Follow-up [name]: 01/24/15 Follow-up type: In-patient    Charyl DancerCARVER, Christne Platts G 01/23/2015, 10:37 AM

## 2015-01-24 MED ORDER — INFLUENZA VAC SPLIT QUAD 0.5 ML IM SUSY
0.5000 mL | PREFILLED_SYRINGE | INTRAMUSCULAR | Status: AC
  Administered 2015-01-24: 0.5 mL via INTRAMUSCULAR

## 2015-01-24 NOTE — Progress Notes (Signed)
Post Partum Day #1 Subjective: no complaints, up ad lib, voiding and tolerating PO.  Reports sore nipples from breastfeeding and has been supplementing with bottles.  Patient encouraged to pump.    Objective: Blood pressure 103/56, pulse 84, temperature 98.4 F (36.9 C), temperature source Oral, resp. rate 16, height 5\' 3"  (1.6 m), weight 196 lb (88.905 kg), last menstrual period 04/13/2014, SpO2 100 %, unknown if currently breastfeeding.  Physical Exam:  General: alert, cooperative and no distress Lochia: appropriate Uterine Fundus: firm Incision: none DVT Evaluation: No evidence of DVT seen on physical exam. Calf/Ankle edema is present.   Recent Labs  01/22/15 0825 01/23/15 0910  HGB 9.7* 7.5*  HCT 29.2* 21.8*    Assessment/Plan: Plan for discharge tomorrow, Breastfeeding and Lactation consult  Anemia, on iron.    LOS: 2 days   Caitlin [name] 01/24/2015, 8:20 AM

## 2015-01-24 NOTE — Lactation Note (Signed)
This note was copied from the chart of Caitlin [name]. Lactation Consultation Note  Patient Name: Caitlin Kathaleen MaserLucy [name] OZHYQ'MToday's Date: 01/24/2015 Reason for consult: Follow-up assessment Baby 31 hours old. Offered Research officer, trade unionpanish Interpreter, mom declined. Mom giving baby a bottle of formula. Discussed supply and demand with mom. Mom states that she nursed and formula-fed with bottle her second child. Mom states that she is not sure how much nursing she will do with this baby. Offered to assist with latching, but mom declined. Baby about to finish entire bottle. Discussed how offering so much formula can interfere with nursing and milk supply. Mom reports nipple soreness, given comfort gels and enc to use EBM. Mom requested a hand pump and it was given. Mom aware of OP/BFSG and LC phone line assistance after D/C.   Maternal Data    Feeding Feeding Type: Formula  LATCH Score/Interventions                      Lactation Tools Discussed/Used     Consult Status Consult Status: PRN    Geralynn OchsWILLIARD, Janeal Abadi 01/24/2015, 11:26 AM

## 2015-01-25 MED ORDER — DIBUCAINE 1 % RE OINT: 1.0000 "application " | TOPICAL_OINTMENT | RECTAL | Status: DC | PRN

## 2015-01-25 MED ORDER — FUSION PLUS PO CAPS: 1.0000 | ORAL_CAPSULE | Freq: Every day | ORAL | Status: DC

## 2015-01-25 MED ORDER — HYDROCHLOROTHIAZIDE 25 MG PO TABS: 25.0000 mg | ORAL_TABLET | Freq: Every day | ORAL | Status: DC

## 2015-01-25 MED ORDER — WITCH HAZEL-GLYCERIN EX PADS: 1.0000 "application " | MEDICATED_PAD | CUTANEOUS | Status: DC | PRN

## 2015-01-25 MED ORDER — IBUPROFEN 600 MG PO TABS: 600.0000 mg | ORAL_TABLET | Freq: Four times a day (QID) | ORAL | Status: DC

## 2015-01-25 MED ORDER — OXYCODONE-ACETAMINOPHEN 5-325 MG PO TABS: 2.0000 | ORAL_TABLET | ORAL | Status: DC | PRN

## 2015-01-25 NOTE — Discharge Summary (Signed)
Obstetric Discharge Summary Reason for Admission: induction of labor and for postdates Prenatal Procedures: ultrasound Intrapartum Procedures: spontaneous vaginal delivery Postpartum Procedures: none Complications-Operative and Postpartum: none HEMOGLOBIN  Date Value Ref Range Status  01/23/2015 7.5* 12.0 - 15.0 g/dL Final    Comment:    REPEATED TO VERIFY DELTA CHECK NOTED    HCT  Date Value Ref Range Status  01/23/2015 21.8* 36.0 - 46.0 % Final    Physical Exam:  General: alert, cooperative and no distress Lochia: appropriate Uterine Fundus: firm Incision: none DVT Evaluation: No evidence of DVT seen on physical exam. No cords or calf tenderness. Calf/Ankle edema is present.  Discharge Diagnoses: Term Pregnancy-delivered  Discharge Information: Date: 01/25/2015 Activity: pelvic rest Diet: routine Medications: PNV, Ibuprofen, Colace, Iron and Percocet Condition: stable Instructions: refer to practice specific booklet Discharge to: home   Newborn Data:   Ovidio HangerHerrera, PendingBaby [[id number]][[id number]]  Live born unspecified sex  Birth Weight:   APGAR: ,    Wayne SeverHerrera, Girl Valentina GuLucy [[id number]][[id number]]  Live born female  Birth Weight: 9 lb 14.6 oz (4495 g) APGAR: 5, 9  Home with mother.  Roe Coombsachelle A Juquan Reznick, CNM 01/25/2015, 8:42 AM

## 2015-01-25 NOTE — Progress Notes (Signed)
Post Partum Day #2 Subjective: no complaints, up ad lib, voiding and tolerating PO. Breast feeding is going well.  Reports that she was able to get some sleep last night.  Desires discharge home today.   Objective: Blood pressure 116/58, pulse 87, temperature 98 F (36.7 C), temperature source Axillary, resp. rate 18, height 5\' 3"  (1.6 m), weight 196 lb (88.905 kg), last menstrual period 04/13/2014, SpO2 99 %, unknown if currently breastfeeding.  Physical Exam:  General: alert, cooperative and no distress Lochia: appropriate Uterine Fundus: firm Incision: none DVT Evaluation: No evidence of DVT seen on physical exam. Calf/Ankle edema is present.   Recent Labs  01/23/15 0910  HGB 7.5*  HCT 21.8*    Assessment/Plan: Discharge home and Breastfeeding. Anemia: will send home on iron.  1-2+ pitting edema in BLE, will give HCTZ for short period of time to help with 3rd spacing.    LOS: 3 days   Caitlin [name] A Webber Michiels 01/25/2015, 8:36 AM

## 2015-01-25 NOTE — Discharge Instructions (Signed)
Cuidados luego de un parto por vía vaginal °(Postpartum Care After Vaginal Delivery) °Luego del nacimiento del bebé deberá permanecer en el hospital durante 24 a 72 horas, excepto que hubiera existido algún problema, o usted sufra alguna enfermedad. Mientras se encuentre en el hospital recibirá ayuda e instrucciones por parte de las enfermeras y el médico, quienes cuidarán de usted y su bebé y le darán consejos para amamantarlo correctamente, especialmente si es el primer hijo.  °En caso de ser necesario, le prescribirán analgésicos. Observará una pequeña hemorragia vaginal y deberá cambiar los apósitos con frecuencia. Lávese las manos cuidadosamente con agua y jabón durante al menos 20 segundos luego de cambiarse el apósito o ir al baño. Si elimina coágulos o aumenta la hemorragia, infórmelo a la enfermera. No deseche los coágulos sanguíneos antes de mostrárselos a la enfermera, para asegurarse de que no es tejido placentario. °Si le han colocado una vía intravenosa, se la retirarán dentro de las 24 horas, si no hay problemas. La primera vez que se levante de la cama o tome una ducha, llame a la enfermera para que la ayude que puede sentirse débil, mareada o desmayarse. Si está amamantando, puede sentir contracciones dolorosas en el útero durante algunas semanas. Esto es normal y necesario, ya que de este modo el útero vuelve a su tamaño normal. Si no está amamantando, use un sostén apretado y disminuya la ingesta de líquidos. Podrán indicarle un medicamento para eliminar la leche de las mamas. No deben administrarse hormonas para suprimir la leche, debido a que pueden causar coágulos sanguíneos. Podrá seguir una dieta normal, excepto que sufra diabetes o presente otros problemas de salud.  °La enfermera colocará bolsas con hielo en el sitio de la episiotomía (agrandamiento quirúrgico de la apertura vaginal) para reducir el dolor y la hinchazón. En algunos casos raros hay dificultad para orinar, entonces la  enfermera deberá vaciarle la vejiga con un catéter. Si le han practicado una ligadura tubaria durante el posparto ("trompas atadas", esterilización femenina), esto no hará que permanezca más tiempo en el hospital. °Podrá tener al bebé en su habitación todo el tiempo que lo desee si el bebé no tiene ningún problema. Lleve y traiga al bebé de la nursery dentro de la cunita. No lo lleve en brazos. No abandone el área de posparto. Si la madre es Rh negativa (falta de una proteína en los glóbulos rojos) y el bebé es Rh positivo, la madre debe aplicarse la vacuna RhoGam para evitar problemas con el factor Rh en futuros embarazos °Le darán instrucciones por escrito para usted y el bebé y los medicamentos necesarios cuando reciba el alta médica. Asegúrese que comprende y sigue las indicaciones. °INSTRUCCIONES PARA EL CUIDADO DOMICILIARIO DESPUÉS DEL PROCEDIMIENTO °· Siga las instrucciones y tome los medicamentos que le indicaron cuando le dieron el alta médica.  °· Utilice los medicamentos de venta libre o de prescripción para el dolor, el malestar o la fiebre, según se lo indique el profesional que lo asiste.  °· No tome aspirina, ya que puede causar hemorragias.  °· Aumente sus actividades un poco cada día para tener más fuerza y resistencia.  °· No beba alcohol, especialmente si está amamantando o toma analgésicos.  °· Tómese la temperatura dos veces por día y regístrela.  °· Podrá tener una pequeña hemorragia durante 2 a 4 semanas. Esto es normal.  °· No utilice tampones o duchas vaginales, use toallas higiénicas.  °· Trate de que alguna persona permanezca con usted y la ayude durante los primeros   días en el hogar.  °· Descanse o duerma una siesta cuando el bebé duerma.  °· Si está amamantando, use un buen sostén. Si no está amamantando, use un sostén apretado, no estimule los pezones y disminuya la ingesta de líquidos.  °· Consuma una dieta sana y siga tomando las vitaminas prenatales.  °· No conduzca vehículos, no  realice actividades pesadas ni viaje hasta que su médico la autorice.  °· No mantenga relaciones sexuales hasta que el médico lo permita.  °· Consulte con el profesional cuando puede comenzar a realizar actividad física y que tipo de ejercicios puede hacer.  °· Comuníquese inmediatamente con el médico si tiene problemas luego del parto.  °· Comuníquese con el pediatra si tiene problemas con el bebé.  °· Programe su visita de control luego del parto y cúmplala.  °SOLICITE ATENCIÓN MÉDICA SI: °· La temperatura se eleva por encima de 100° F (37,8° C).  °· Aumenta la hemorragia vaginal o elimina coágulos. Conserve algunos coágulos para mostrárselos al médico.  °· Observa sangre o siente dolor al orinar.  °· Presenta secreción vaginal con olor fétido.  °· Aumenta el dolor o la inflamación en el sitio de la episiotomía (agrandamiento quirúrgico de la apertura vaginal).  °· Sufre una cefalea grave.  °· Se siente deprimida.  °· La incisión se abre.  °· Se siente mareada o sufre un desmayo.  °· Aparece una erupción cutánea.  °· Tiene una reacción o problemas con su medicamento.  °· Siente dolor u observa enrojecimiento e hinchazón en el sitio de la vía intravenosa.  °SOLICITE ATENCIÓN MÉDICA DE INMEDIATO SI: °· Siente dolor en el pecho.  °· Comienza a sentir falta de aire.  °· Se desmaya.  °· Siente dolor, con o sin hinchazón e irritación en la pierna.  °· Tiene una hemorragia vaginal abundante, con o sin coágulos  °· Siente dolor en el estómago.  °· Observa una secreción vaginal con mal olor.  °ASEGURESE QUE:  °· Comprende estas instrucciones.  °· Controlará su enfermedad.  °· Solicitará ayuda de inmediato si no mejora o empeora.  °Document Released: 11/02/2006 Document Re-Released: 04/01/2009 °[employer]® Patient Information ©2011 [employer]. °

## 2015-01-31 ENCOUNTER — Ambulatory Visit: Payer: Medicaid Other | Admitting: Obstetrics

## 2015-02-28 ENCOUNTER — Encounter: Payer: Self-pay | Admitting: Obstetrics

## 2015-02-28 ENCOUNTER — Ambulatory Visit (INDEPENDENT_AMBULATORY_CARE_PROVIDER_SITE_OTHER): Payer: Medicaid Other | Admitting: Obstetrics

## 2015-02-28 DIAGNOSIS — Z30014 Encounter for initial prescription of intrauterine contraceptive device: Secondary | ICD-10-CM

## 2015-02-28 NOTE — Progress Notes (Signed)
Subjective:     Caitlin [name] is a 35 y.o. female who presents for a postpartum visit. She is 4 weeks postpartum following a spontaneous vaginal delivery. I have fully reviewed the prenatal and intrapartum course. The delivery was at 40 gestational weeks. Outcome: spontaneous vaginal delivery. Anesthesia: epidural. Postpartum course has been normal. Baby's course has been normal. Baby is feeding by both breast and bottle - Similac Advance. Bleeding thin lochia. Bowel function is normal. Bladder function is normal. Patient is not sexually active. Contraception method is abstinence. Postpartum depression screening: negative.  Tobacco, alcohol and substance abuse history reviewed.  Adult immunizations reviewed including TDAP, rubella and varicella.  The following portions of the patient's history were reviewed and updated as appropriate: allergies, current medications, past family history, past medical history, past social history, past surgical history and problem list.  Review of Systems A comprehensive review of systems was negative.   Objective:    BP 95/57 mmHg  Pulse 78  Temp(Src) 98.5 F (36.9 C)  Wt 166 lb (75.297 kg)  General:  alert and no distress   Breasts:  inspection negative, no nipple discharge or bleeding, no masses or nodularity palpable  Lungs: clear to auscultation bilaterally  Heart:  regular rate and rhythm, S1, S2 normal, no murmur, click, rub or gallop  Abdomen: soft, non-tender; bowel sounds normal; no masses,  no organomegaly   Vulva:  normal  Vagina: normal vagina, no discharge, exudate, lesion, or erythema  Cervix:  no cervical motion tenderness  Corpus: normal size, contour, position, consistency, mobility, non-tender  Adnexa:  no mass, fullness, tenderness  Rectal Exam: Not performed.           Assessment:     Normal postpartum exam. Pap smear not done at today's visit.    Plan:    1. Contraception: IUD 2. IUD Rx 3. Follow up in: 4 weeks or as needed.    Healthy lifestyle practices reviewed

## 2015-03-28 ENCOUNTER — Ambulatory Visit (INDEPENDENT_AMBULATORY_CARE_PROVIDER_SITE_OTHER): Payer: Medicaid Other | Admitting: Obstetrics

## 2015-03-28 VITALS — BP 98/62 | HR 73 | Wt 166.0 lb

## 2015-03-28 DIAGNOSIS — Z30014 Encounter for initial prescription of intrauterine contraceptive device: Secondary | ICD-10-CM

## 2015-03-28 DIAGNOSIS — Z1389 Encounter for screening for other disorder: Secondary | ICD-10-CM | POA: Diagnosis not present

## 2015-03-28 DIAGNOSIS — B3731 Acute candidiasis of vulva and vagina: Secondary | ICD-10-CM

## 2015-03-28 DIAGNOSIS — R309 Painful micturition, unspecified: Secondary | ICD-10-CM

## 2015-03-28 DIAGNOSIS — Z30430 Encounter for insertion of intrauterine contraceptive device: Secondary | ICD-10-CM

## 2015-03-28 DIAGNOSIS — Z3202 Encounter for pregnancy test, result negative: Secondary | ICD-10-CM

## 2015-03-28 DIAGNOSIS — Z01818 Encounter for other preprocedural examination: Secondary | ICD-10-CM

## 2015-03-28 DIAGNOSIS — B373 Candidiasis of vulva and vagina: Secondary | ICD-10-CM

## 2015-03-28 LAB — POCT URINALYSIS DIPSTICK
Bilirubin, UA: NEGATIVE
GLUCOSE UA: NEGATIVE
KETONES UA: NEGATIVE
LEUKOCYTES UA: NEGATIVE
Nitrite, UA: NEGATIVE
Protein, UA: NEGATIVE
SPEC GRAV UA: 1.02
Urobilinogen, UA: NEGATIVE
pH, UA: 5

## 2015-03-28 LAB — POCT URINE PREGNANCY: Preg Test, Ur: NEGATIVE

## 2015-03-28 MED ORDER — TERCONAZOLE 0.4 % VA CREA: 1.0000 | TOPICAL_CREAM | Freq: Every day | VAGINAL | Status: DC

## 2015-03-29 ENCOUNTER — Encounter: Payer: Self-pay | Admitting: Obstetrics

## 2015-03-29 NOTE — Progress Notes (Signed)
IUD Insertion Procedure Note  Pre-operative Diagnosis: Desires LARC  Post-operative Diagnosis: same  Indications: contraception  Procedure Details  Urine pregnancy test was done in office and result was negative.  The risks (including infection, bleeding, pain, and uterine perforation) and benefits of the procedure were explained to the patient and Written informed consent was obtained.    Cervix cleansed with Betadine. Uterus sounded to 10 cm. IUD inserted without difficulty. String visible and trimmed. Patient tolerated procedure well.  IUD Information: Mirena, Lot # [id number], Expiration date 035 / 219.  Condition: Stable  Complications: None  Plan:  The patient was advised to call for any fever or for prolonged or severe pain or bleeding. She was advised to use NSAID as needed for mild to moderate pain.   Attending Physician Documentation: I was present for or participated in the entire procedure, including opening and closing.

## 2015-04-03 LAB — SURESWAB, VAGINOSIS/VAGINITIS PLUS
ATOPOBIUM VAGINAE: NOT DETECTED Log (cells/mL)
C. ALBICANS, DNA: NOT DETECTED
C. PARAPSILOSIS, DNA: NOT DETECTED
C. TRACHOMATIS RNA, TMA: NOT DETECTED
C. TROPICALIS, DNA: NOT DETECTED
C. glabrata, DNA: NOT DETECTED
Gardnerella vaginalis: NOT DETECTED Log (cells/mL)
LACTOBACILLUS SPECIES: NOT DETECTED Log (cells/mL)
MEGASPHAERA SPECIES: NOT DETECTED Log (cells/mL)
N. GONORRHOEAE RNA, TMA: NOT DETECTED
T. vaginalis RNA, QL TMA: NOT DETECTED

## 2015-05-09 ENCOUNTER — Encounter: Payer: Self-pay | Admitting: Obstetrics

## 2015-05-09 ENCOUNTER — Ambulatory Visit (INDEPENDENT_AMBULATORY_CARE_PROVIDER_SITE_OTHER): Payer: Medicaid Other | Admitting: Obstetrics

## 2015-05-09 VITALS — BP 109/65 | HR 88 | Temp 98.7°F | Wt 175.0 lb

## 2015-05-09 DIAGNOSIS — Z30431 Encounter for routine checking of intrauterine contraceptive device: Secondary | ICD-10-CM

## 2015-05-09 NOTE — Progress Notes (Signed)
Subjective:    Caitlin [name] is a 35 y.o. female who presents for contraception counseling. The patient has no complaints today. The patient is sexually active. Pertinent past medical history: none.  The information documented in the HPI was reviewed and verified.  Menstrual History: OB History    Gravida Para Term Preterm AB TAB SAB Ectopic Multiple Living   4 4 4  0 0 0 0 0 0 4       No LMP recorded.   Patient Active Problem List   Diagnosis Date Noted  . NSVD (normal spontaneous vaginal delivery) 01/23/2015  . Pregnancy 01/15/2015   Past Medical History  Diagnosis Date  . Anemia     Past Surgical History  Procedure Laterality Date  . No past surgeries       Current outpatient prescriptions:  .  ibuprofen (ADVIL,MOTRIN) 600 MG tablet, Take 1 tablet (600 mg total) by mouth every 6 (six) hours., Disp: 90 tablet, Rfl: 4 .  Prenat-FeCbn-FeAspGl-FA-Omega (OB COMPLETE PETITE) 35-5-1-200 MG CAPS, Take 1 capsule by mouth daily before breakfast., Disp: 90 capsule, Rfl: 3 No Known Allergies  Social History  Substance Use Topics  . Smoking status: Never Smoker   . Smokeless tobacco: Never Used  . Alcohol Use: No    Family History  Problem Relation Age of Onset  . Hypertension Paternal Grandmother   . Diabetes Paternal Grandmother   . Heart disease Paternal Grandmother   . Hearing loss Maternal Grandmother     with age  . Asthma Neg Hx   . Stroke Neg Hx        Review of Systems Constitutional: negative for weight loss Genitourinary:negative for abnormal menstrual periods and vaginal discharge   Objective:   BP 109/65 mmHg  Pulse 88  Temp(Src) 98.7 F (37.1 C)  Wt 175 lb (79.379 kg)           General:  Alert and no distress Abdomen:  normal findings: no organomegaly, soft, non-tender and no hernia  Pelvis:  External genitalia: normal general appearance Urinary system: urethral meatus normal and bladder without fullness, nontender Vaginal: normal without  tenderness, induration or masses Cervix: normal appearance Adnexa: normal bimanual exam Uterus: anteverted and non-tender, normal size   Lab Review Urine pregnancy test Labs reviewed yes Radiologic studies reviewed no    Assessment:    35 y.o., continuing IUD, no contraindications.   Plan:    All questions answered. Discussed healthy lifestyle modifications. Follow up as needed.  No orders of the defined types were placed in this encounter.   No orders of the defined types were placed in this encounter.   Need to obtain previous records Follow up as needed.

## 2015-08-14 ENCOUNTER — Ambulatory Visit: Payer: Medicaid Other | Admitting: Obstetrics

## 2015-08-15 ENCOUNTER — Ambulatory Visit (INDEPENDENT_AMBULATORY_CARE_PROVIDER_SITE_OTHER): Payer: Medicaid Other | Admitting: Obstetrics

## 2015-08-15 ENCOUNTER — Encounter: Payer: Self-pay | Admitting: Obstetrics

## 2015-08-15 VITALS — BP 108/62 | HR 80 | Temp 99.0°F | Resp 16 | Wt 180.3 lb

## 2015-08-15 DIAGNOSIS — R635 Abnormal weight gain: Secondary | ICD-10-CM

## 2015-08-15 DIAGNOSIS — Z3040 Encounter for surveillance of contraceptives, unspecified: Secondary | ICD-10-CM

## 2015-08-15 DIAGNOSIS — Z01419 Encounter for gynecological examination (general) (routine) without abnormal findings: Secondary | ICD-10-CM | POA: Diagnosis not present

## 2015-08-15 DIAGNOSIS — Z0000 Encounter for general adult medical examination without abnormal findings: Secondary | ICD-10-CM

## 2015-08-15 DIAGNOSIS — Z30431 Encounter for routine checking of intrauterine contraceptive device: Secondary | ICD-10-CM

## 2015-08-15 DIAGNOSIS — Z124 Encounter for screening for malignant neoplasm of cervix: Secondary | ICD-10-CM

## 2015-08-16 LAB — COMPREHENSIVE METABOLIC PANEL
ALBUMIN: 4.5 g/dL (ref 3.5–5.5)
ALK PHOS: 88 IU/L (ref 39–117)
ALT: 34 IU/L — ABNORMAL HIGH (ref 0–32)
AST: 27 IU/L (ref 0–40)
Albumin/Globulin Ratio: 1.7 (ref 1.2–2.2)
BILIRUBIN TOTAL: 0.4 mg/dL (ref 0.0–1.2)
BUN/Creatinine Ratio: 25 — ABNORMAL HIGH (ref 9–23)
BUN: 16 mg/dL (ref 6–20)
CO2: 22 mmol/L (ref 18–29)
CREATININE: 0.64 mg/dL (ref 0.57–1.00)
Calcium: 8.9 mg/dL (ref 8.7–10.2)
Chloride: 103 mmol/L (ref 96–106)
GFR calc non Af Amer: 117 mL/min/{1.73_m2} (ref >59)
GFR, EST AFRICAN AMERICAN: 135 mL/min/{1.73_m2} (ref >59)
GLOBULIN, TOTAL: 2.6 g/dL (ref 1.5–4.5)
GLUCOSE: 77 mg/dL (ref 65–99)
POTASSIUM: 4 mmol/L (ref 3.5–5.2)
SODIUM: 141 mmol/L (ref 134–144)
TOTAL PROTEIN: 7.1 g/dL (ref 6.0–8.5)

## 2015-08-16 LAB — CBC
HEMATOCRIT: 36.7 % (ref 34.0–46.6)
Hemoglobin: 12.2 g/dL (ref 11.1–15.9)
MCH: 28.1 pg (ref 26.6–33.0)
MCHC: 33.2 g/dL (ref 31.5–35.7)
MCV: 85 fL (ref 79–97)
PLATELETS: 244 10*3/uL (ref 150–379)
RBC: 4.34 x10E6/uL (ref 3.77–5.28)
RDW: 13.2 % (ref 12.3–15.4)
WBC: 5.6 10*3/uL (ref 3.4–10.8)

## 2015-08-16 LAB — TSH: TSH: 1.86 u[IU]/mL (ref 0.450–4.500)

## 2015-08-18 LAB — PAP IG AND HPV HIGH-RISK
HPV, high-risk: NEGATIVE
PAP Smear Comment: 0

## 2015-08-19 ENCOUNTER — Encounter: Payer: Self-pay | Admitting: Obstetrics

## 2015-08-19 NOTE — Progress Notes (Signed)
Patient ID: Caitlin [name], female   DOB: 10-05-1980, 35 y.o.   MRN: [id number]   Subjective:        Caitlin [name] is a 35 y.o. female here for a routine exam.  Current complaints: None.    Personal health questionnaire:  Is patient Ashkenazi Jewish, have a family history of breast and/or ovarian cancer: no Is there a family history of uterine cancer diagnosed at age < 92, gastrointestinal cancer, urinary tract cancer, family member who is a Personnel officer syndrome-associated carrier: no Is the patient overweight and hypertensive, family history of diabetes, personal history of gestational diabetes, preeclampsia or PCOS: no Is patient over 44, have PCOS,  family history of premature CHD under age 40, diabetes, smoke, have hypertension or peripheral artery disease:  no At any time, has a partner hit, kicked or otherwise hurt or frightened you?: no Over the past 2 weeks, have you felt down, depressed or hopeless?: no Over the past 2 weeks, have you felt little interest or pleasure in doing things?:no   Gynecologic History No LMP recorded. Contraception: IUD Last Pap: 2016. Results were: normal Last mammogram: n/a. Results were: n/a  Obstetric History OB History  Gravida Para Term Preterm AB Living  0 0 4  SAB TAB Ectopic Multiple Live Births  0 0 0 0 4    # Outcome Date GA Lbr Len/2nd Weight Sex Delivery Anes PTL Lv  4 Term 01/23/15 [redacted]w[redacted]d 181:54 / 01:19 9 lb 14.6 oz (4.495 kg) F Vag-Spont EPI  LIV  3 Term 02/07/04 [redacted]w[redacted]d  8 lb 12 oz (3.969 kg) M Vag-Spont None N LIV  2 Term 11/04/01 [redacted]w[redacted]d  9 lb (4.082 kg) M Vag-Spont EPI N LIV  1 Term 08/19/99 [redacted]w[redacted]d  7 lb (3.175 kg) F Vag-Spont None N LIV      Past Medical History:  Diagnosis Date  . Anemia     Past Surgical History:  Procedure Laterality Date  . NO PAST SURGERIES       Current Outpatient Prescriptions:  .  levonorgestrel (MIRENA) 20 MCG/24HR IUD, 1 each by Intrauterine route once., Disp: , Rfl:  .  ibuprofen (ADVIL,MOTRIN)  600 MG tablet, Take 1 tablet (600 mg total) by mouth every 6 (six) hours. (Patient not taking: Reported on 08/15/2015), Disp: 90 tablet, Rfl: 4 .  Prenat-FeCbn-FeAspGl-FA-Omega (OB COMPLETE PETITE) 35-5-1-200 MG CAPS, Take 1 capsule by mouth daily before breakfast. (Patient not taking: Reported on 08/15/2015), Disp: 90 capsule, Rfl: 3 No Known Allergies  Social History  Substance Use Topics  . Smoking status: Never Smoker  . Smokeless tobacco: Never Used  . Alcohol use No    Family History  Problem Relation Age of Onset  . Hypertension Paternal Grandmother   . Diabetes Paternal Grandmother   . Heart disease Paternal Grandmother   . Hearing loss Maternal Grandmother     with age  . Asthma Neg Hx   . Stroke Neg Hx       Review of Systems  Constitutional: negative for fatigue and weight loss Respiratory: negative for cough and wheezing Cardiovascular: negative for chest pain, fatigue and palpitations Gastrointestinal: negative for abdominal pain and change in bowel habits Musculoskeletal:negative for myalgias Neurological: negative for gait problems and tremors Behavioral/Psych: negative for abusive relationship, depression Endocrine: negative for temperature intolerance   Genitourinary:negative for abnormal menstrual periods, genital lesions, hot flashes, sexual problems and vaginal discharge Integument/breast: negative for breast lump, breast tenderness, nipple discharge and skin lesion(s)    Objective:  BP 108/62   Pulse 80   Temp 99 F (37.2 C)   Resp 16   Wt 180 lb 4.8 oz (81.8 kg)   Breastfeeding? Yes   BMI 31.94 kg/m  General:   alert  Skin:   no rash or abnormalities  Lungs:   clear to auscultation bilaterally  Heart:   regular rate and rhythm, S1, S2 normal, no murmur, click, rub or gallop  Breasts:   normal without suspicious masses, skin or nipple changes or axillary nodes  Abdomen:  normal findings: no organomegaly, soft, non-tender and no hernia   Pelvis:  External genitalia: normal general appearance Urinary system: urethral meatus normal and bladder without fullness, nontender Vaginal: normal without tenderness, induration or masses Cervix: normal appearance.  IUD string visible. Adnexa: normal bimanual exam Uterus: anteverted and non-tender, normal size   Lab Review Urine pregnancy test Labs reviewed yes Radiologic studies reviewed no     Assessment:    Healthy female exam.    IUD surveillance.  Pleased with IUD.   Plan:    Education reviewed: calcium supplements, low fat, low cholesterol diet, self breast exams and weight bearing exercise. Contraception: IUD. Follow up in: 1 year.   Meds ordered this encounter  Medications  . levonorgestrel (MIRENA) 20 MCG/24HR IUD    Sig: 1 each by Intrauterine route once.   Orders Placed This Encounter  Procedures  . Comprehensive metabolic panel  . TSH  . CBC

## 2015-08-21 LAB — NUSWAB VG+, CANDIDA 6SP
Atopobium vaginae: HIGH {score} — AB
Candida albicans, NAA: POSITIVE — AB
Candida glabrata, NAA: NEGATIVE
Candida krusei, NAA: NEGATIVE
Candida lusitaniae, NAA: NEGATIVE
Candida parapsilosis, NAA: NEGATIVE
Candida tropicalis, NAA: NEGATIVE
Chlamydia trachomatis, NAA: NEGATIVE
Neisseria gonorrhoeae, NAA: NEGATIVE
Trich vag by NAA: NEGATIVE

## 2015-08-23 ENCOUNTER — Other Ambulatory Visit: Payer: Self-pay | Admitting: Obstetrics

## 2015-08-23 DIAGNOSIS — B3731 Acute candidiasis of vulva and vagina: Secondary | ICD-10-CM

## 2015-08-23 DIAGNOSIS — B9689 Other specified bacterial agents as the cause of diseases classified elsewhere: Secondary | ICD-10-CM

## 2015-08-23 DIAGNOSIS — B373 Candidiasis of vulva and vagina: Secondary | ICD-10-CM

## 2015-08-23 DIAGNOSIS — N760 Acute vaginitis: Principal | ICD-10-CM

## 2015-08-23 MED ORDER — FLUCONAZOLE 150 MG PO TABS: 150.0000 mg | ORAL_TABLET | Freq: Once | ORAL | 2 refills | Status: AC

## 2015-08-23 MED ORDER — METRONIDAZOLE 500 MG PO TABS: 500.0000 mg | ORAL_TABLET | Freq: Two times a day (BID) | ORAL | 2 refills | Status: DC

## 2015-11-05 IMAGING — US US MFM OB LIMITED
1 series · 15 of 27 positions shown · non-contrast
Comparison: none

[Series 1: us mfm ob limited · 15 of 27 slices shown]
[im 1/27]
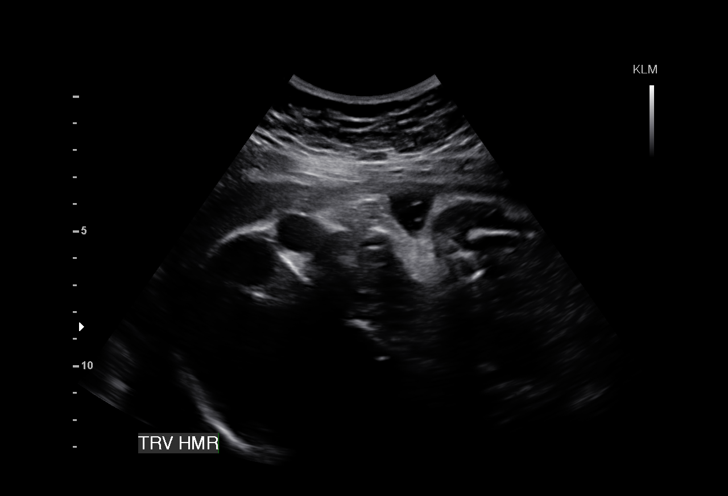
[im 3/27]
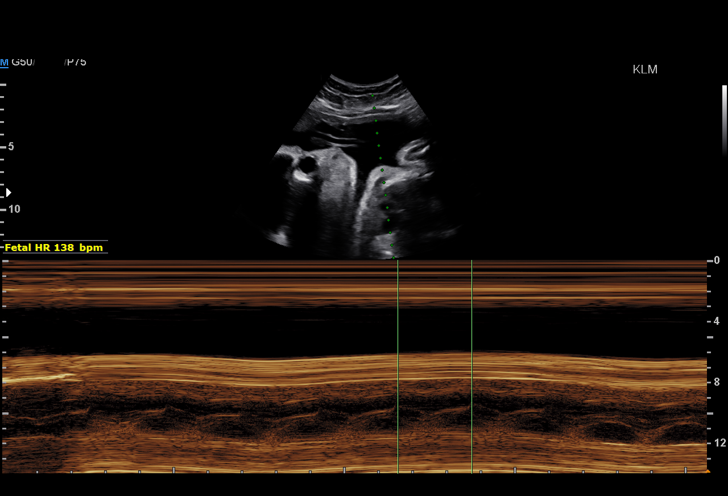
[im 5/27]
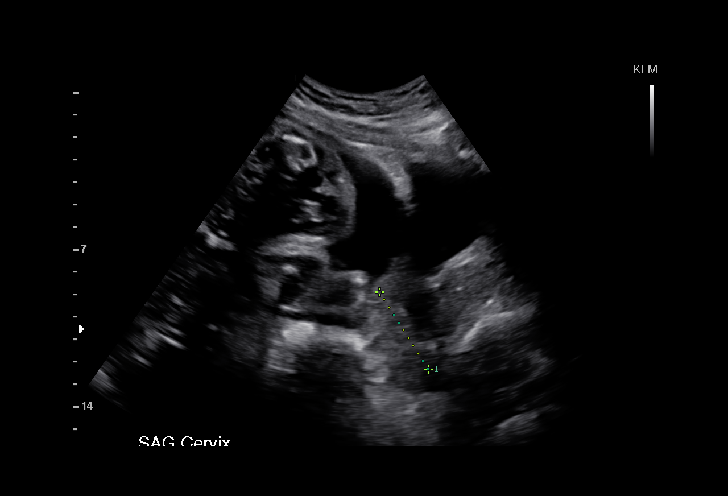
[im 7/27]
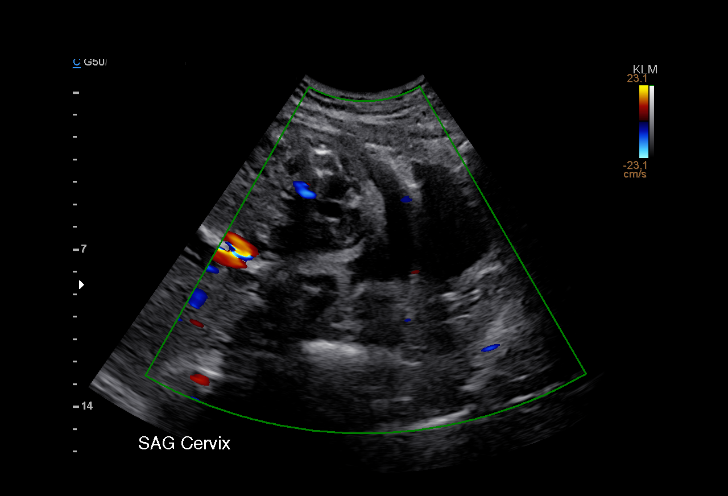
[im 9/27]
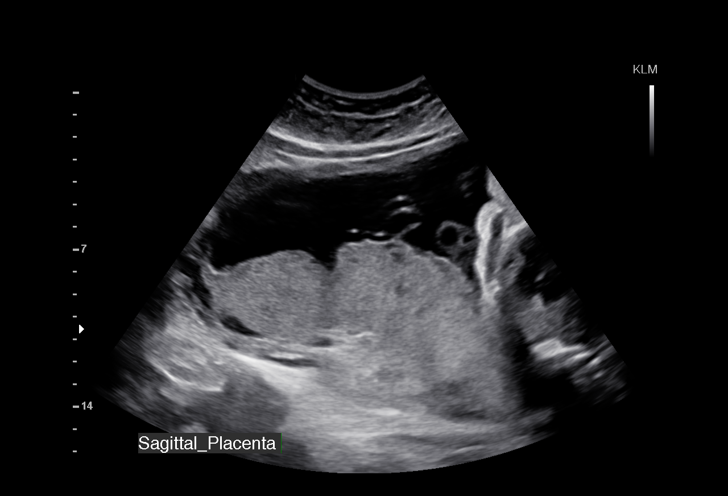
[im 10/27]
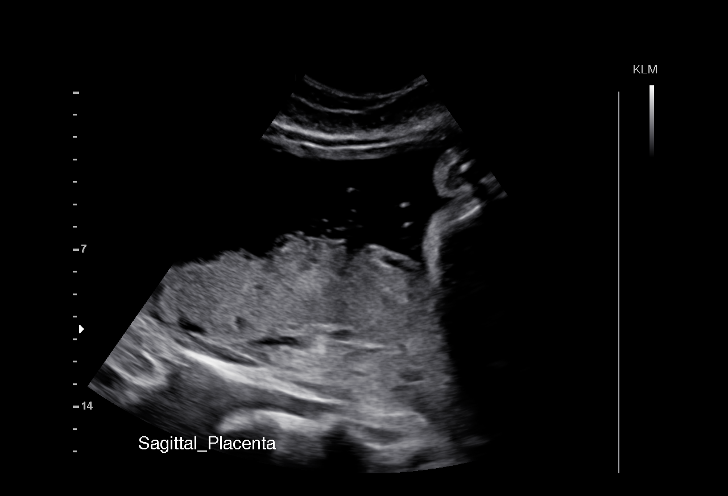
[im 12/27]
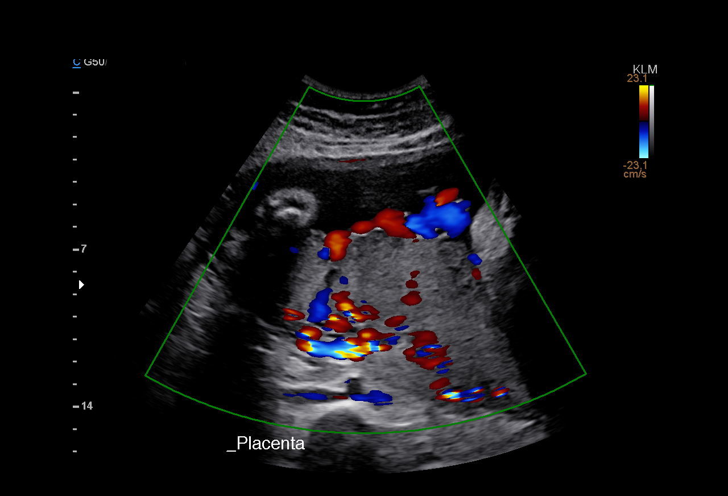
[im 14/27]
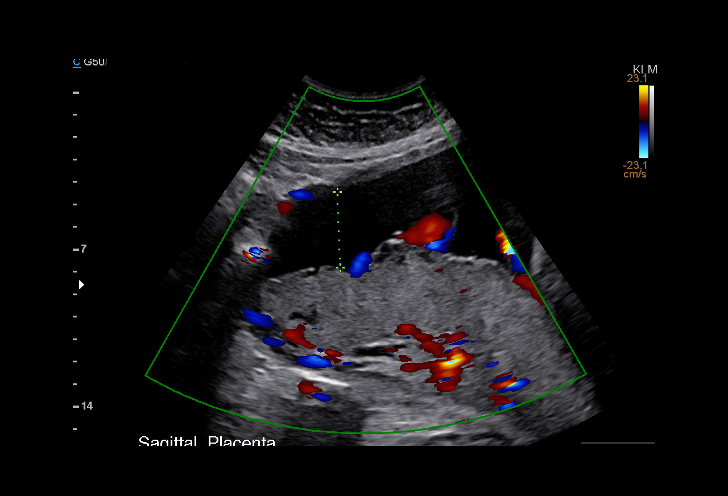
[im 16/27]
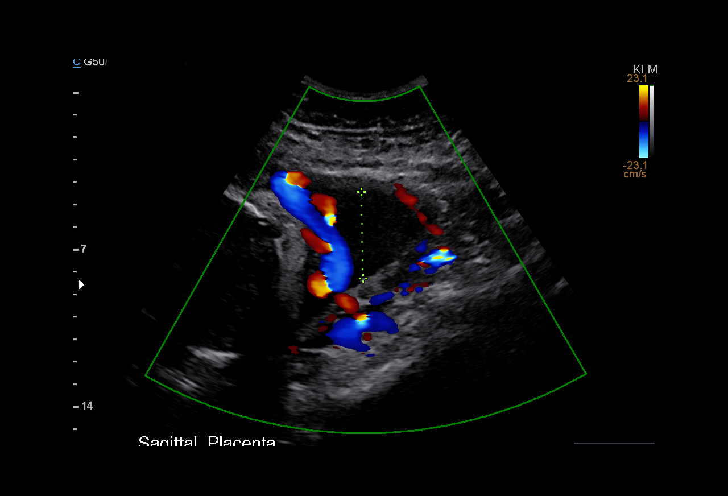
[im 18/27]
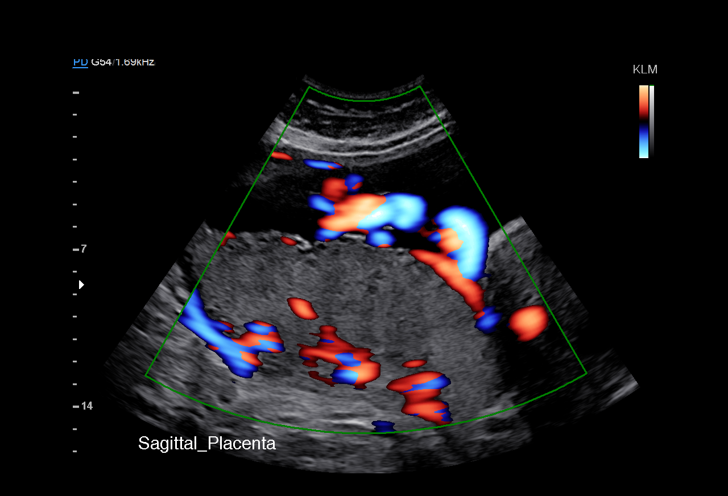
[im 19/27]
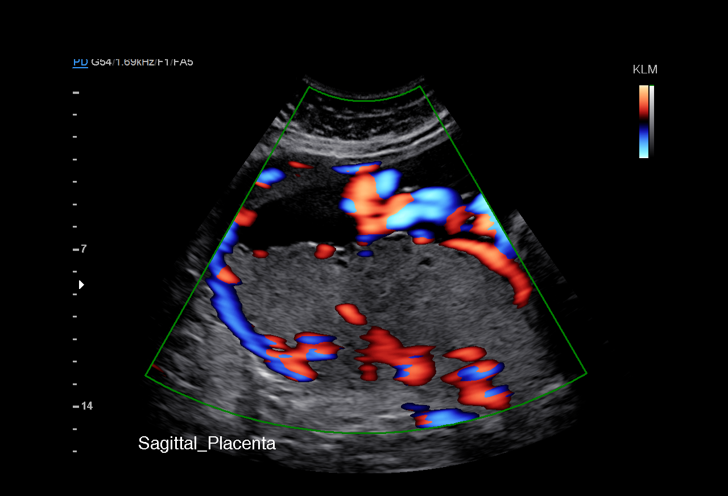
[im 21/27]
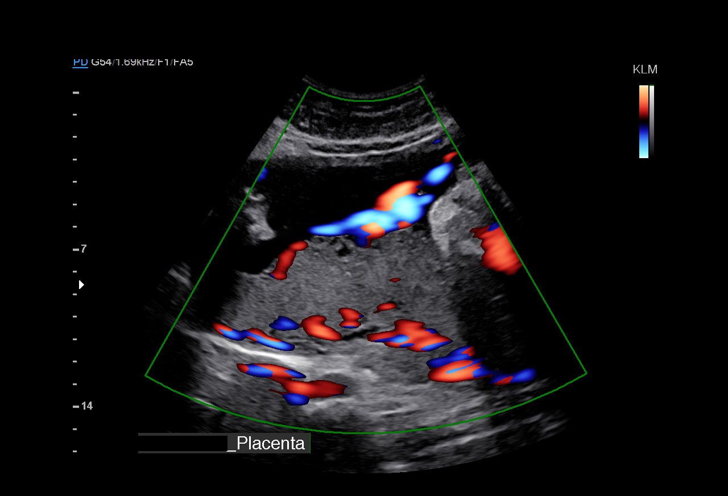
[im 23/27]
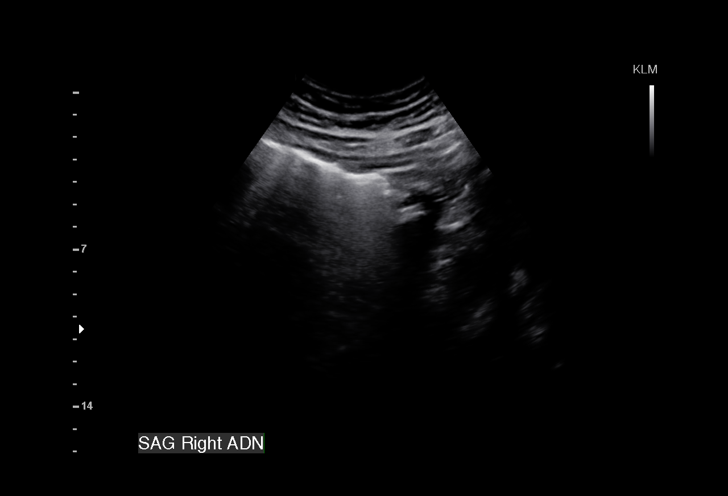
[im 25/27]
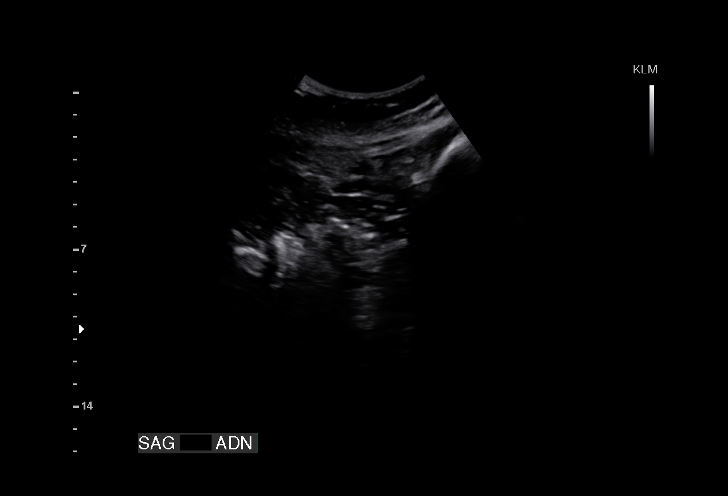
[im 27/27]
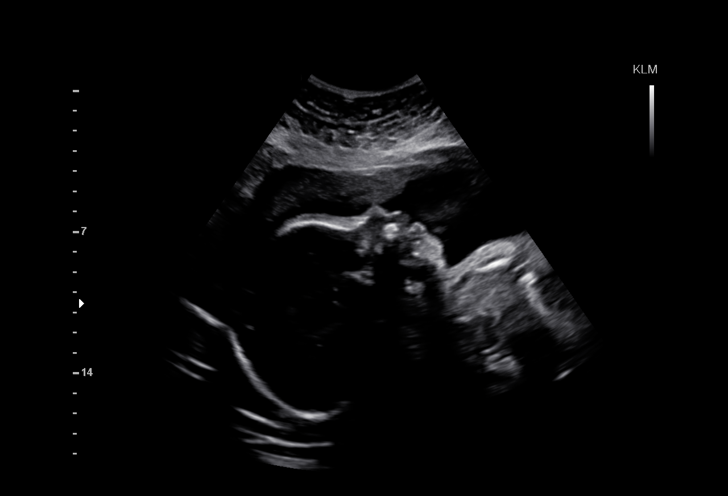

[15 of 27 positions shown; findings below may reference images not displayed]

OBSTETRICS REPORT
(Signed Final 11/14/2014 [DATE])

Name:       DASHUTA MASLOV                          Visit  11/13/2014 [DATE]
Date:

By:
Service(s) Provided

US MFM OB LIMITED                                      76815.01
Indications

Vaginal bleeding in pregnancy, third trimester
Abdominal pain in pregnancy
30 weeks gestation of pregnancy
Fetal Evaluation

Num Of             1
Fetuses:
Fetal Heart        138                          bpm
Rate:
Cardiac Activity:  Observed
Presentation:      Transverse, head to
maternal right
Placenta:          Posterior, above cervical
os
P. Cord            Visualized
Insertion:

Comment     No placental abruption or previa identified.
:

Amniotic Fluid
AFI FV:      Subjectively within normal limits
AFI Sum:     13.79    cm      45  %Tile     Larg Pckt:    3.85   cm
RUQ:   3.37    cm    RLQ:   3.12    cm   LUQ:    3.45    cm   LLQ:    3.85   cm
Gestational Age

LMP:           30w 4d        Date:  04/13/14                  EDD:   01/18/15
Best:          30w 4d    Det. By:   LMP  (04/13/14)           EDD:   01/18/15
Cervix Uterus Adnexa

Cervix:       Not visualized (advanced GA >79wks)

Left Ovary:    Not visualized. No adnexal mass visualized.
Right Ovary:   Not visualized. No adnexal mass visualized.
Adnexa:     No abnormality visualized.
Impression

Single IUP at 30w 4d
Limited ultrasound performed due to vaginal bleeding
Posterior placenta without previa
No findings suspicious for abruption are noted
Normal amniotic fluid volume
Recommendations

Follow-up ultrasounds as clinically indicated.

## 2015-11-14 ENCOUNTER — Ambulatory Visit (INDEPENDENT_AMBULATORY_CARE_PROVIDER_SITE_OTHER): Payer: Medicaid Other | Admitting: Obstetrics

## 2015-11-14 ENCOUNTER — Other Ambulatory Visit (HOSPITAL_COMMUNITY)
Admission: RE | Admit: 2015-11-14 | Discharge: 2015-11-14 | Disposition: A | Discharge status: Home / Self Care | Payer: Medicaid Other | Source: Ambulatory Visit | Attending: Obstetrics | Admitting: Obstetrics

## 2015-11-14 ENCOUNTER — Encounter: Payer: Self-pay | Admitting: Obstetrics

## 2015-11-14 VITALS — BP 93/55 | HR 65 | Temp 97.8°F | Ht 64.0 in | Wt 180.6 lb

## 2015-11-14 DIAGNOSIS — B3731 Acute candidiasis of vulva and vagina: Secondary | ICD-10-CM

## 2015-11-14 DIAGNOSIS — Z30431 Encounter for routine checking of intrauterine contraceptive device: Secondary | ICD-10-CM | POA: Diagnosis not present

## 2015-11-14 DIAGNOSIS — B373 Candidiasis of vulva and vagina: Secondary | ICD-10-CM | POA: Diagnosis not present

## 2015-11-14 DIAGNOSIS — Z113 Encounter for screening for infections with a predominantly sexual mode of transmission: Secondary | ICD-10-CM | POA: Diagnosis not present

## 2015-11-14 DIAGNOSIS — T8332XA Displacement of intrauterine contraceptive device, initial encounter: Secondary | ICD-10-CM

## 2015-11-14 NOTE — Progress Notes (Signed)
Subjective:    Caitlin [name] is a 35 y.o. female who presents for IUD Surveillance. The patient has no complaints today. The patient is sexually active. Pertinent past medical history: none.  The information documented in the HPI was reviewed and verified.  Menstrual History: OB History    Gravida Para Term Preterm AB Living   4 4 4  0 0 4   SAB TAB Ectopic Multiple Live Births   0 0 0 0 4       No LMP recorded.   Patient Active Problem List   Diagnosis Date Noted  . NSVD (normal spontaneous vaginal delivery) 01/23/2015  . Pregnancy 01/15/2015   Past Medical History:  Diagnosis Date  . Anemia     Past Surgical History:  Procedure Laterality Date  . NO PAST SURGERIES       Current Outpatient Prescriptions:  .  levonorgestrel (MIRENA) 20 MCG/24HR IUD, 1 each by Intrauterine route once., Disp: , Rfl:  .  ibuprofen (ADVIL,MOTRIN) 600 MG tablet, Take 1 tablet (600 mg total) by mouth every 6 (six) hours. (Patient not taking: Reported on 11/14/2015), Disp: 90 tablet, Rfl: 4 .  metroNIDAZOLE (FLAGYL) 500 MG tablet, Take 1 tablet (500 mg total) by mouth 2 (two) times daily. (Patient not taking: Reported on 11/14/2015), Disp: 14 tablet, Rfl: 2 .  Prenat-FeCbn-FeAspGl-FA-Omega (OB COMPLETE PETITE) 35-5-1-200 MG CAPS, Take 1 capsule by mouth daily before breakfast. (Patient not taking: Reported on 11/14/2015), Disp: 90 capsule, Rfl: 3 No Known Allergies  Social History  Substance Use Topics  . Smoking status: Never Smoker  . Smokeless tobacco: Never Used  . Alcohol use No    Family History  Problem Relation Age of Onset  . Hypertension Paternal Grandmother   . Diabetes Paternal Grandmother   . Heart disease Paternal Grandmother   . Hearing loss Maternal Grandmother     with age  . Asthma Neg Hx   . Stroke Neg Hx        Review of Systems Constitutional: negative for weight loss Genitourinary:negative for abnormal menstrual periods and vaginal discharge   Objective:    BP (!) 93/55   Pulse 65   Temp 97.8 F (36.6 C) (Oral)   Ht 5\' 4"  (1.626 m)   Wt 180 lb 9.6 oz (81.9 kg)   BMI 31.00 kg/m             General:  Alert and no distress Abdomen:  normal findings: no organomegaly, soft, non-tender and no hernia  Pelvis:  External genitalia: normal general appearance Urinary system: urethral meatus normal and bladder without fullness, nontender Vaginal: normal without tenderness, induration or masses Cervix: normal appearance.  IUD string not visible Adnexa: normal bimanual exam Uterus: anteverted and non-tender, normal size   Lab Review Urine pregnancy test Labs reviewed yes Radiologic studies reviewed no  50% of 15 min visit spent on counseling and coordination of care.   Assessment:    35 y.o., continuing IUD, no contraindications.    IUD strings not visible.  Plan:    Ultrasound ordered for IUD position  All questions answered. Follow up in 2 week. Wet prep.     No orders of the defined types were placed in this encounter.  Orders Placed This Encounter  Procedures  . US Pelvis Complete    Standing Status:   Future    Standing Expiration Date:   01/13/2017    Order Specific Question:   Reason for Exam (SYMPTOM  OR DIAGNOSIS REQUIRED)  Answer:   IUD Placement    Order Specific Question:   Preferred imaging location?    Answer:   Internal  . US Transvaginal Non-OB    Standing Status:   Future    Standing Expiration Date:   01/13/2017    Order Specific Question:   Reason for Exam (SYMPTOM  OR DIAGNOSIS REQUIRED)    Answer:   IUD Placement    Order Specific Question:   Preferred imaging location?    Answer:   Internal

## 2015-11-14 NOTE — Addendum Note (Signed)
Addended by: Francene FindersJAMES, QUINETTA C on: 11/14/2015 05:24 PM   Modules accepted: Orders

## 2015-11-20 LAB — CERVICOVAGINAL ANCILLARY ONLY
BACTERIAL VAGINITIS: POSITIVE — AB
CANDIDA VAGINITIS: NEGATIVE

## 2015-11-21 ENCOUNTER — Other Ambulatory Visit: Payer: Self-pay | Admitting: Obstetrics

## 2015-11-21 DIAGNOSIS — N760 Acute vaginitis: Principal | ICD-10-CM

## 2015-11-21 DIAGNOSIS — B9689 Other specified bacterial agents as the cause of diseases classified elsewhere: Secondary | ICD-10-CM

## 2015-11-21 MED ORDER — METRONIDAZOLE 500 MG PO TABS: 500.0000 mg | ORAL_TABLET | Freq: Two times a day (BID) | ORAL | 2 refills | Status: DC

## 2015-11-26 ENCOUNTER — Ambulatory Visit (INDEPENDENT_AMBULATORY_CARE_PROVIDER_SITE_OTHER): Payer: Medicaid Other

## 2015-11-26 ENCOUNTER — Ambulatory Visit (INDEPENDENT_AMBULATORY_CARE_PROVIDER_SITE_OTHER): Payer: Medicaid Other | Admitting: Obstetrics

## 2015-11-26 ENCOUNTER — Encounter: Payer: Self-pay | Admitting: Obstetrics

## 2015-11-26 VITALS — BP 96/62 | HR 82 | Temp 97.6°F

## 2015-11-26 DIAGNOSIS — T8332XA Displacement of intrauterine contraceptive device, initial encounter: Secondary | ICD-10-CM

## 2015-11-26 DIAGNOSIS — Z30431 Encounter for routine checking of intrauterine contraceptive device: Secondary | ICD-10-CM

## 2015-11-26 NOTE — Progress Notes (Signed)
Subjective:    Caitlin [name] is a 35 y.o. female who presents for results of ultrasound for IUD placement.  The IUD strings could not be visualized on last visit. The patient has no complaints today. The patient is sexually active. Pertinent past medical history: none.  The information documented in the HPI was reviewed and verified.  Menstrual History: OB History    Gravida Para Term Preterm AB Living   4 4 4  0 0 4   SAB TAB Ectopic Multiple Live Births   0 0 0 0 4      No LMP recorded.   Patient Active Problem List   Diagnosis Date Noted  . NSVD (normal spontaneous vaginal delivery) 01/23/2015  . Pregnancy 01/15/2015   Past Medical History:  Diagnosis Date  . Anemia     Past Surgical History:  Procedure Laterality Date  . NO PAST SURGERIES       Current Outpatient Prescriptions:  .  ibuprofen (ADVIL,MOTRIN) 600 MG tablet, Take 1 tablet (600 mg total) by mouth every 6 (six) hours. (Patient not taking: Reported on 11/26/2015), Disp: 90 tablet, Rfl: 4 .  levonorgestrel (MIRENA) 20 MCG/24HR IUD, 1 each by Intrauterine route once., Disp: , Rfl:  .  metroNIDAZOLE (FLAGYL) 500 MG tablet, Take 1 tablet (500 mg total) by mouth 2 (two) times daily. (Patient not taking: Reported on 11/26/2015), Disp: 14 tablet, Rfl: 2 .  Prenat-FeCbn-FeAspGl-FA-Omega (OB COMPLETE PETITE) 35-5-1-200 MG CAPS, Take 1 capsule by mouth daily before breakfast. (Patient not taking: Reported on 11/26/2015), Disp: 90 capsule, Rfl: 3 No Known Allergies  Social History  Substance Use Topics  . Smoking status: Never Smoker  . Smokeless tobacco: Never Used  . Alcohol use No    Family History  Problem Relation Age of Onset  . Hypertension Paternal Grandmother   . Diabetes Paternal Grandmother   . Heart disease Paternal Grandmother   . Hearing loss Maternal Grandmother     with age  . Asthma Neg Hx   . Stroke Neg Hx        Review of Systems Constitutional: negative for weight loss Genitourinary:negative  for abnormal menstrual periods and vaginal discharge   Objective:   BP 96/62   Pulse 82   Temp 97.6 F (36.4 C) (Oral)     PE:  Deferred  Lab Review Urine pregnancy test Labs reviewed yes Radiologic studies reviewed:  IUD in good position in superior lumen.  The arms of IUD not well visualized.  >50% of 10 min visit spent on counseling and coordination of care.    Assessment:    35 y.o., continuing IUD, no contraindications.   Plan:    All questions answered. Follow up in 8 months.  Pap smear and Annual. No orders of the defined types were placed in this encounter.  No orders of the defined types were placed in this encounter.

## 2015-11-27 ENCOUNTER — Encounter: Payer: Self-pay | Admitting: *Deleted

## 2016-05-12 ENCOUNTER — Ambulatory Visit: Payer: Medicaid Other | Admitting: Obstetrics

## 2016-06-03 ENCOUNTER — Encounter: Payer: Self-pay | Admitting: Gynecology

## 2017-02-25 ENCOUNTER — Ambulatory Visit: Payer: Self-pay | Admitting: Obstetrics

## 2017-03-29 ENCOUNTER — Ambulatory Visit: Payer: Self-pay | Admitting: Obstetrics

## 2017-08-30 ENCOUNTER — Ambulatory Visit: Payer: Medicaid Other | Admitting: Nurse Practitioner

## 2018-01-14 ENCOUNTER — Other Ambulatory Visit (HOSPITAL_COMMUNITY)
Admission: RE | Admit: 2018-01-14 | Discharge: 2018-01-14 | Disposition: A | Discharge status: Home / Self Care | Payer: Medicaid Other | Source: Ambulatory Visit | Attending: Obstetrics | Admitting: Obstetrics

## 2018-01-14 ENCOUNTER — Ambulatory Visit (INDEPENDENT_AMBULATORY_CARE_PROVIDER_SITE_OTHER): Payer: Medicaid Other | Admitting: Obstetrics

## 2018-01-14 ENCOUNTER — Encounter: Payer: Self-pay | Admitting: Obstetrics

## 2018-01-14 VITALS — BP 111/73 | HR 88 | Resp 16 | Ht 63.0 in | Wt 176.2 lb

## 2018-01-14 DIAGNOSIS — Z0000 Encounter for general adult medical examination without abnormal findings: Secondary | ICD-10-CM

## 2018-01-14 DIAGNOSIS — Z01419 Encounter for gynecological examination (general) (routine) without abnormal findings: Secondary | ICD-10-CM

## 2018-01-14 DIAGNOSIS — R52 Pain, unspecified: Secondary | ICD-10-CM

## 2018-01-14 MED ORDER — IBUPROFEN 800 MG PO TABS: 800.0000 mg | ORAL_TABLET | Freq: Three times a day (TID) | ORAL | 5 refills | Status: DC | PRN

## 2018-01-14 MED ORDER — PRENATAL PLUS 27-1 MG PO TABS: 1.0000 | ORAL_TABLET | Freq: Every day | ORAL | 11 refills | Status: DC

## 2018-01-16 ENCOUNTER — Encounter: Payer: Self-pay | Admitting: Obstetrics

## 2018-01-16 NOTE — Progress Notes (Signed)
Subjective:        Caitlin [name] is a 37 y.o. female here for a routine exam.  Current complaints: Pelvic pain off and on.    Personal health questionnaire:  Is patient Ashkenazi Jewish, have a family history of breast and/or ovarian cancer: no Is there a family history of uterine cancer diagnosed at age < 3250, gastrointestinal cancer, urinary tract cancer, family member who is a Personnel officerLynch syndrome-associated carrier: no Is the patient overweight and hypertensive, family history of diabetes, personal history of gestational diabetes, preeclampsia or PCOS: no Is patient over 9155, have PCOS,  family history of premature CHD under age 37, diabetes, smoke, have hypertension or peripheral artery disease:  no At any time, has a partner hit, kicked or otherwise hurt or frightened you?: no Over the past 2 weeks, have you felt down, depressed or hopeless?: no Over the past 2 weeks, have you felt little interest or pleasure in doing things?:no   Gynecologic History No LMP recorded. (Menstrual status: IUD). Contraception: IUD Last Pap: 2017. Results were: normal Last mammogram: n/a. Results were: n/a  Obstetric History OB History  Gravida Para Term Preterm AB Living  4 4 4  0 0 4  SAB TAB Ectopic Multiple Live Births  0 0 0 0 4    # Outcome Date GA Lbr Len/2nd Weight Sex Delivery Anes PTL Lv  4 Term 01/23/15 759w5d 181:54 / 01:19 9 lb 14.6 oz (4.495 kg) F Vag-Spont EPI  LIV  3 Term 02/07/04 6462w0d  8 lb 12 oz (3.969 kg) M Vag-Spont None N LIV  2 Term 11/04/01 6162w0d  9 lb (4.082 kg) M Vag-Spont EPI N LIV  1 Term 08/19/99 6562w0d  7 lb (3.175 kg) F Vag-Spont None N LIV    Past Medical History:  Diagnosis Date  . Anemia     Past Surgical History:  Procedure Laterality Date  . NO PAST SURGERIES       Current Outpatient Medications:  .  levonorgestrel (MIRENA) 20 MCG/24HR IUD, 1 each by Intrauterine route once., Disp: , Rfl:  .  ibuprofen (ADVIL,MOTRIN) 800 MG tablet, Take 1 tablet (800 mg  total) by mouth every 8 (eight) hours as needed., Disp: 30 tablet, Rfl: 5 .  prenatal vitamin w/FE, FA (PRENATAL 1 + 1) 27-1 MG TABS tablet, Take 1 tablet by mouth daily before breakfast., Disp: 30 each, Rfl: 11 No Known Allergies  Social History   Tobacco Use  . Smoking status: Never Smoker  . Smokeless tobacco: Never Used  Substance Use Topics  . Alcohol use: No    Family History  Problem Relation Age of Onset  . Hypertension Paternal Grandmother   . Diabetes Paternal Grandmother   . Heart disease Paternal Grandmother   . Hearing loss Maternal Grandmother        with age  . Asthma Neg Hx   . Stroke Neg Hx       Review of Systems  Constitutional: negative for fatigue and weight loss Respiratory: negative for cough and wheezing Cardiovascular: negative for chest pain, fatigue and palpitations Gastrointestinal: negative for abdominal pain and change in bowel habits Musculoskeletal:negative for myalgias Neurological: negative for gait problems and tremors Behavioral/Psych: negative for abusive relationship, depression Endocrine: negative for temperature intolerance    Genitourinary:negative for abnormal menstrual periods, genital lesions, hot flashes, sexual problems and vaginal discharge Integument/breast: negative for breast lump, breast tenderness, nipple discharge and skin lesion(s)    Objective:       BP 111/73 (  BP Location: Right Arm, Patient Position: Sitting, Cuff Size: Normal)   Pulse 88   Resp 16   Ht 5\' 3"  (1.6 m)   Wt 176 lb 3.2 oz (79.9 kg)   Breastfeeding No   BMI 31.21 kg/m  General:   alert  Skin:   no rash or abnormalities  Lungs:   clear to auscultation bilaterally  Heart:   regular rate and rhythm, S1, S2 normal, no murmur, click, rub or gallop  Breasts:   normal without suspicious masses, skin or nipple changes or axillary nodes  Abdomen:  normal findings: no organomegaly, soft, non-tender and no hernia  Pelvis:  External genitalia: normal  general appearance Urinary system: urethral meatus normal and bladder without fullness, nontender Vaginal: normal without tenderness, induration or masses Cervix: normal appearance Adnexa: normal bimanual exam Uterus: anteverted and non-tender, normal size   Lab Review Urine pregnancy test Labs reviewed yes Radiologic studies reviewed no  50 of 20 min visit spent on counseling and coordination of care.   Assessment:     1. Encounter for well woman exam with routine gynecological exam Rx: - Cytology - PAP( [facility]) - Cervicovaginal ancillary only( [facility])  2. Pain aggravated by activities of daily living Rx: - ibuprofen (ADVIL,MOTRIN) 800 MG tablet; Take 1 tablet (800 mg total) by mouth every 8 (eight) hours as needed.  Dispense: 30 tablet; Refill: 5  3. Routine adult health maintenance Rx: - prenatal vitamin w/FE, FA (PRENATAL 1 + 1) 27-1 MG TABS tablet; Take 1 tablet by mouth daily before breakfast.  Dispense: 30 each; Refill: 11   Plan:    Education reviewed: calcium supplements, depression evaluation, low fat, low cholesterol diet, safe sex/STD prevention, self breast exams and weight bearing exercise. Contraception: IUD. Follow up in: 1 year.   Meds ordered this encounter  Medications  . prenatal vitamin w/FE, FA (PRENATAL 1 + 1) 27-1 MG TABS tablet    Sig: Take 1 tablet by mouth daily before breakfast.    Dispense:  30 each    Refill:  11  . ibuprofen (ADVIL,MOTRIN) 800 MG tablet    Sig: Take 1 tablet (800 mg total) by mouth every 8 (eight) hours as needed.    Dispense:  30 tablet    Refill:  5   No orders of the defined types were placed in this encounter.   Brock BadHARLES A. Ralynn San MD 01-14-2018

## 2018-01-17 LAB — CERVICOVAGINAL ANCILLARY ONLY
CHLAMYDIA, DNA PROBE: NEGATIVE
NEISSERIA GONORRHEA: NEGATIVE

## 2018-01-18 LAB — CYTOLOGY - PAP
Diagnosis: NEGATIVE
HPV (WINDOPATH): NOT DETECTED

## 2018-06-07 ENCOUNTER — Encounter: Payer: Self-pay | Admitting: Obstetrics

## 2018-06-07 ENCOUNTER — Other Ambulatory Visit: Payer: Self-pay

## 2018-06-07 ENCOUNTER — Ambulatory Visit (INDEPENDENT_AMBULATORY_CARE_PROVIDER_SITE_OTHER): Payer: Medicaid Other | Admitting: Obstetrics

## 2018-06-07 DIAGNOSIS — T8332XD Displacement of intrauterine contraceptive device, subsequent encounter: Secondary | ICD-10-CM

## 2018-06-07 DIAGNOSIS — R102 Pelvic and perineal pain: Secondary | ICD-10-CM

## 2018-06-07 MED ORDER — IBUPROFEN 800 MG PO TABS: 800.0000 mg | ORAL_TABLET | Freq: Three times a day (TID) | ORAL | 5 refills | Status: DC | PRN

## 2018-06-07 NOTE — Progress Notes (Signed)
Pt [facility] [address] on Friday and they did a bimanual exam and could not see IUD strings. Mirena placed 03/2015. Pt states she is experiencing pain that comes and goes. The pt states they diagnosed her with a UTI and gave her antibiotics. Pt reports that her abdominal pain has improved since UTI treatment. Pt is concerned that her IUD strings could not be felt.

## 2018-06-07 NOTE — Progress Notes (Signed)
    TELEHEALTH GYNECOLOGY VIRTUAL VIDEO VISIT ENCOUNTER NOTE  Provider location: [facility] for [employer] at [place]   I connected with Caitlin [name] on 06/07/18 at  2:15 PM EDT by WebEx GYN MyChart Video Encounter at home and verified that I am speaking with the correct person using two identifiers.   I discussed the limitations, risks, security and privacy concerns of performing an evaluation and management service by telephone and the availability of in person appointments. I also discussed with the patient that there may be a patient responsible charge related to this service. The patient expressed understanding and agreed to proceed.   History:  Caitlin [name] is a 38 y.o. [phone] female being evaluated today for history of pelvic pain over the past several weeks.  Evaluated at the [facility] and IUD strings not visualized.  She was treated for UTI.  And pain has subsided, some.  She denies any abnormal vaginal discharge or bleeding.     Past Medical History:  Diagnosis Date  . Anemia    Past Surgical History:  Procedure Laterality Date  . NO PAST SURGERIES     The following portions of the patient's history were reviewed and updated as appropriate: allergies, current medications, past family history, past medical history, past social history, past surgical history and problem list.   Health Maintenance:  Normal pap and negative HRHPV on 01-14-2018.    Review of Systems:  Pertinent items noted in HPI and remainder of comprehensive ROS otherwise negative.  Physical Exam:   General:  Alert, oriented and cooperative. Patient appears to be in no acute distress.  Mental Status: Normal mood and affect. Normal behavior. Normal judgment and thought content.   Respiratory: Normal respiratory effort, no problems with respiration noted  Rest of physical exam deferred due to type of encounter  Labs and Imaging No results found for this or any previous visit (from the past 336  hour(s)). No results found.     Assessment and Plan:     1. Pelvic pain in female Rx: - ibuprofen (ADVIL) 800 MG tablet; Take 1 tablet (800 mg total) by mouth every 8 (eight) hours as needed.  Dispense: 30 tablet; Refill: 5  2. Intrauterine contraceptive device threads lost, subsequent encounter Rx: - US PELVIC COMPLETE WITH TRANSVAGINAL; Future       I discussed the assessment and treatment plan with the patient. The patient was provided an opportunity to ask questions and all were answered. The patient agreed with the plan and demonstrated an understanding of the instructions.   The patient was advised to call back or seek an in-person evaluation/go to the ED if the symptoms worsen or if the condition fails to improve as anticipated.  I provided 10 minutes of face-to-face time during this encounter.   Coral Ceo, MD Center for [facility], [facility] 06-07-2018

## 2018-06-16 ENCOUNTER — Other Ambulatory Visit: Payer: Self-pay

## 2018-06-16 ENCOUNTER — Ambulatory Visit (HOSPITAL_COMMUNITY)
Admission: RE | Admit: 2018-06-16 | Discharge: 2018-06-16 | Disposition: A | Discharge status: Home / Self Care | Payer: Medicaid Other | Source: Ambulatory Visit | Attending: Obstetrics | Admitting: Obstetrics

## 2018-06-16 DIAGNOSIS — R102 Pelvic and perineal pain: Secondary | ICD-10-CM | POA: Insufficient documentation

## 2018-06-16 DIAGNOSIS — X58XXXD Exposure to other specified factors, subsequent encounter: Secondary | ICD-10-CM | POA: Diagnosis not present

## 2018-06-16 DIAGNOSIS — T8332XD Displacement of intrauterine contraceptive device, subsequent encounter: Secondary | ICD-10-CM | POA: Diagnosis present

## 2018-06-22 ENCOUNTER — Encounter: Payer: Self-pay | Admitting: Obstetrics

## 2018-06-22 ENCOUNTER — Ambulatory Visit (INDEPENDENT_AMBULATORY_CARE_PROVIDER_SITE_OTHER): Payer: Medicaid Other | Admitting: Obstetrics

## 2018-06-22 DIAGNOSIS — R102 Pelvic and perineal pain: Secondary | ICD-10-CM

## 2018-06-22 DIAGNOSIS — T8332XD Displacement of intrauterine contraceptive device, subsequent encounter: Secondary | ICD-10-CM

## 2018-06-22 NOTE — Progress Notes (Signed)
TELEHEALTH GYNECOLOGY VIRTUAL VIDEO VISIT ENCOUNTER NOTE  Provider [facility] [place]   I connected with Caitlin MaserLucy [name] on 06/22/18 at  9:00 AM EDT by WebEx GYN MyChart Video Encounter at home and verified that I am speaking with the correct person using two identifiers.   I discussed the limitations, risks, security and privacy concerns of performing an evaluation and management service by telephone and the availability of in person appointments. I also discussed with the patient that there may be a patient responsible charge related to this service. The patient expressed understanding and agreed to proceed.   History:  Caitlin [name] is a 38 y.o. [phone] female being evaluated today for follow up from ultrasound for pelvic pain and IUD location. She denies any abnormal vaginal discharge, bleeding, pelvic pain or other concerns.       Past Medical History:  Diagnosis Date  . Anemia    Past Surgical History:  Procedure Laterality Date  . NO PAST SURGERIES     The following portions of the patient's history were reviewed and updated as appropriate: allergies, current medications, past family history, past medical history, past social history, past surgical history and problem list.   Health Maintenance:  Normal pap and negative HRHPV on 01-14-2018.    Review of Systems:  Pertinent items noted in HPI and remainder of comprehensive ROS otherwise negative.  Physical Exam:   General:  Alert, oriented and cooperative. Patient appears to be in no acute distress.  Mental Status: Normal mood and affect. Normal behavior. Normal judgment and thought content.   Respiratory: Normal respiratory effort, no problems with respiration noted  Rest of physical exam deferred due to type of encounter  Labs and Imaging No results found for this or any previous visit (from the past 336 hour(s)). [place] Pelvic Complete With Transvaginal  Result Date: 06/16/2018 CLINICAL  DATA:  IUD thread lost, pelvic pain EXAM: TRANSABDOMINAL AND TRANSVAGINAL ULTRASOUND OF PELVIS TECHNIQUE: Both transabdominal and transvaginal ultrasound examinations of the pelvis were performed. Transabdominal technique was performed for global imaging of the pelvis including uterus, ovaries, adnexal regions, and pelvic cul-de-sac. It was necessary to proceed with endovaginal exam following the transabdominal exam to visualize the uterus, endometrium, ovaries and adnexa. COMPARISON:  None FINDINGS: Uterus Measurements: 7.9 x 3.2 x 5.1 cm = volume: 66 mL. No fibroids or other mass visualized. Endometrium Thickness: 3 mm in thickness. IUD noted within the endometrial canal Right ovary Measurements: 3.4 x 2.7 x 3.0 cm = volume: 14.2 mL. Normal appearance/no adnexal mass. Left ovary Measurements: 3.1 x 1.9 x 1.6 cm = volume: 5.1 mL. Normal appearance/no adnexal mass. Other findings Trace free fluid IMPRESSION: IUD seen within the endometrial canal. No acute findings. Electronically Signed   By: Charlett NoseKevin  Dover M.D.   On: 06/16/2018 16:48       Assessment and Plan:     1. Pelvic pain in female.  Resolved. - ultrasound is WNL's and IUD is in normal fundal location   2. Intrauterine contraceptive device threads lost, subsequent encounter - normal location on ultrasound.  Strings probably curled back into cervical canal       I discussed the assessment and treatment plan with the patient. The patient was provided an opportunity to ask questions and all were answered. The patient agreed with the plan and demonstrated an understanding of the instructions.   The patient was advised to call back or seek an in-person evaluation/go to the ED if the  symptoms worsen or if the condition fails to improve as anticipated.  I provided 10 minutes of face-to-face time during this encounter.   Coral Ceo, MD Center for [facility], [facility] 06-22-2018

## 2019-01-05 ENCOUNTER — Other Ambulatory Visit: Payer: Self-pay

## 2019-01-05 DIAGNOSIS — Z20822 Contact with and (suspected) exposure to covid-19: Secondary | ICD-10-CM

## 2019-01-06 LAB — NOVEL CORONAVIRUS, NAA: SARS-CoV-2, NAA: DETECTED — AB

## 2019-01-08 ENCOUNTER — Telehealth: Payer: Self-pay | Admitting: Critical Care Medicine

## 2019-01-08 NOTE — Telephone Encounter (Signed)
-----   Message from Sarah E Ellington, RN sent at 01/07/2019  7:02 AM EST -----  ----- Message ----- From: Interface, [facility] Lab Results In Sent: 01/06/2019  11:37 AM EST To: Mobile Screening Testing Result Pool   

## 2019-01-08 NOTE — Telephone Encounter (Signed)
I connected with this patient who is Covid positive from December 18 testing event.  This patient has mild symptoms at this time.  She has a cough muscle aches headaches sore throat.  She has had symptoms since 13 December.  She can be out of her isolation on December 24.  She does not qualify for monoclonal antibody infusion

## 2020-01-23 ENCOUNTER — Other Ambulatory Visit (HOSPITAL_COMMUNITY)
Admission: RE | Admit: 2020-01-23 | Discharge: 2020-01-23 | Disposition: A | Discharge status: Home / Self Care | Payer: Medicaid Other | Source: Ambulatory Visit | Attending: Obstetrics | Admitting: Obstetrics

## 2020-01-23 ENCOUNTER — Ambulatory Visit (INDEPENDENT_AMBULATORY_CARE_PROVIDER_SITE_OTHER): Payer: Medicaid Other | Admitting: Obstetrics

## 2020-01-23 ENCOUNTER — Other Ambulatory Visit: Payer: Self-pay

## 2020-01-23 ENCOUNTER — Encounter: Payer: Self-pay | Admitting: Obstetrics

## 2020-01-23 VITALS — BP 107/59 | HR 76 | Wt 175.6 lb

## 2020-01-23 DIAGNOSIS — Z113 Encounter for screening for infections with a predominantly sexual mode of transmission: Secondary | ICD-10-CM

## 2020-01-23 DIAGNOSIS — N898 Other specified noninflammatory disorders of vagina: Secondary | ICD-10-CM

## 2020-01-23 DIAGNOSIS — N644 Mastodynia: Secondary | ICD-10-CM | POA: Diagnosis not present

## 2020-01-23 DIAGNOSIS — Z01419 Encounter for gynecological examination (general) (routine) without abnormal findings: Secondary | ICD-10-CM | POA: Insufficient documentation

## 2020-01-23 NOTE — Progress Notes (Signed)
Subjective:        Caitlin [name] is a 40 y.o. female here for a routine exam.  Current complaints: Left breast pain.  Also complains of vaginal discharge    Personal health questionnaire:  Is patient Ashkenazi Jewish, have a family history of breast and/or ovarian cancer: no Is there a family history of uterine cancer diagnosed at age < 75, gastrointestinal cancer, urinary tract cancer, family member who is a Personnel officer syndrome-associated carrier: no Is the patient overweight and hypertensive, family history of diabetes, personal history of gestational diabetes, preeclampsia or PCOS: no Is patient over 93, have PCOS,  family history of premature CHD under age 20, diabetes, smoke, have hypertension or peripheral artery disease:  no At any time, has a partner hit, kicked or otherwise hurt or frightened you?: no Over the past 2 weeks, have you felt down, depressed or hopeless?: no Over the past 2 weeks, have you felt little interest or pleasure in doing things?:no   Gynecologic History No LMP recorded. (Menstrual status: IUD). Contraception: IUD Last Pap: 01-14-2018. Results were: normal Last mammogram: n/a. Results were: n/a  Obstetric History OB History  Gravida Para Term Preterm AB Living  4 4 4  0 0 4  SAB IAB Ectopic Multiple Live Births  0 0 0 0 4    # Outcome Date GA Lbr Len/2nd Weight Sex Delivery Anes PTL Lv  4 Term 01/23/15 [redacted]w[redacted]d 181:54 / 01:19 9 lb 14.6 oz (4.495 kg) F Vag-Spont EPI  LIV  3 Term 02/07/04 [redacted]w[redacted]d  8 lb 12 oz (3.969 kg) M Vag-Spont None N LIV  2 Term 11/04/01 [redacted]w[redacted]d  9 lb (4.082 kg) M Vag-Spont EPI N LIV  1 Term 08/19/99 [redacted]w[redacted]d  7 lb (3.175 kg) F Vag-Spont None N LIV    Past Medical History:  Diagnosis Date  . Anemia     Past Surgical History:  Procedure Laterality Date  . NO PAST SURGERIES       Current Outpatient Medications:  .  levonorgestrel (MIRENA) 20 MCG/24HR IUD, 1 each by Intrauterine route once., Disp: , Rfl:  .  ibuprofen (ADVIL) 800 MG  tablet, Take 1 tablet (800 mg total) by mouth every 8 (eight) hours as needed. (Patient not taking: Reported on 01/23/2020), Disp: 30 tablet, Rfl: 5 .  ibuprofen (ADVIL,MOTRIN) 800 MG tablet, Take 1 tablet (800 mg total) by mouth every 8 (eight) hours as needed. (Patient not taking: No sig reported), Disp: 30 tablet, Rfl: 5 .  prenatal vitamin w/FE, FA (PRENATAL 1 + 1) 27-1 MG TABS tablet, Take 1 tablet by mouth daily before breakfast. (Patient not taking: No sig reported), Disp: 30 each, Rfl: 11 No Known Allergies  Social History   Tobacco Use  . Smoking status: Never Smoker  . Smokeless tobacco: Never Used  Substance Use Topics  . Alcohol use: No    Family History  Problem Relation Age of Onset  . Hypertension Paternal Grandmother   . Diabetes Paternal Grandmother   . Heart disease Paternal Grandmother   . Hearing loss Maternal Grandmother        with age  . Asthma Neg Hx   . Stroke Neg Hx       Review of Systems  Constitutional: negative for fatigue and weight loss Respiratory: negative for cough and wheezing Cardiovascular: negative for chest pain, fatigue and palpitations Gastrointestinal: negative for abdominal pain and change in bowel habits Musculoskeletal:negative for myalgias Neurological: negative for gait problems and tremors Behavioral/Psych: negative for abusive  relationship, depression Endocrine: negative for temperature intolerance    Genitourinary:negative for abnormal menstrual periods, genital lesions, hot flashes, sexual problems.  Positive for vaginal discharge Integument/breast: positive for left breast pain. negative for breast lump, nipple discharge and skin lesion(s)    Objective:       BP (!) 107/59   Pulse 76   Wt 175 lb 9.6 oz (79.7 kg)   BMI 31.11 kg/m  General:   alert and no distress  Skin:   no rash or abnormalities  Lungs:   clear to auscultation bilaterally  Heart:   regular rate and rhythm, S1, S2 normal, no murmur, click, rub or  gallop  Breasts:   normal without suspicious masses, skin or nipple changes or axillary nodes  Abdomen:  normal findings: no organomegaly, soft, non-tender and no hernia  Pelvis:  External genitalia: normal general appearance Urinary system: urethral meatus normal and bladder without fullness, nontender Vaginal: normal without tenderness, induration or masses Cervix: normal appearance Adnexa: normal bimanual exam Uterus: anteverted and non-tender, normal size   Lab Review Urine pregnancy test Labs reviewed yes Radiologic studies reviewed no  50% of 20 min visit spent on counseling and coordination of care.   Assessment:   1. Encounter for gynecological examination with Papanicolaou smear of cervix Rx: - Cytology - PAP( [place])  2. Vaginal discharge Rx: - Cervicovaginal ancillary only  3. Screening for STD (sexually transmitted disease) Rx: - HIV - RPR  4. Breast pain Rx: - MM DIAG BREAST TOMO BILATERAL; Future    Plan:    Education reviewed: calcium supplements, depression evaluation, low fat, low cholesterol diet, safe sex/STD prevention, self breast exams and weight bearing exercise. Contraception: IUD. Mammogram ordered. Follow up in: 3 months.  Mirena IUD Removal / Reinsertion   Orders Placed This Encounter  Procedures  . MM DIAG BREAST TOMO BILATERAL    Standing Status:   Future    Standing Expiration Date:   01/22/2021    Order Specific Question:   Reason for Exam (SYMPTOM  OR DIAGNOSIS REQUIRED)    Answer:   Breast pain - Left Breast    Order Specific Question:   Is the patient pregnant?    Answer:   No    Order Specific Question:   Preferred imaging location?    Answer:   [facility]  . Hepatitis C antibody  . HIV Antibody (routine testing w rflx)  . Hepatitis B surface antigen  . RPR    Brock Bad, MD 01/23/2020 10:02 AM

## 2020-01-23 NOTE — Progress Notes (Signed)
Pt presents for annual and all STD testing. Pt c/o intermittent L breast pain.  Normal pap 01/14/2018 Mirena expires 03/2020

## 2020-01-24 ENCOUNTER — Other Ambulatory Visit: Payer: Self-pay | Admitting: Obstetrics

## 2020-01-24 ENCOUNTER — Other Ambulatory Visit: Payer: Self-pay | Admitting: Obstetrics and Gynecology

## 2020-01-24 ENCOUNTER — Other Ambulatory Visit: Payer: Self-pay

## 2020-01-24 DIAGNOSIS — N644 Mastodynia: Secondary | ICD-10-CM

## 2020-01-24 LAB — CERVICOVAGINAL ANCILLARY ONLY
Chlamydia: NEGATIVE
Comment: NEGATIVE
Comment: NORMAL
Neisseria Gonorrhea: NEGATIVE

## 2020-01-24 LAB — RPR: RPR Ser Ql: NONREACTIVE

## 2020-01-24 LAB — HIV ANTIBODY (ROUTINE TESTING W REFLEX): HIV Screen 4th Generation wRfx: NONREACTIVE

## 2020-01-25 ENCOUNTER — Ambulatory Visit
Admission: RE | Admit: 2020-01-25 | Discharge: 2020-01-25 | Disposition: A | Discharge status: Home / Self Care | Payer: Medicaid Other | Source: Ambulatory Visit | Attending: Obstetrics and Gynecology | Admitting: Obstetrics and Gynecology

## 2020-01-25 ENCOUNTER — Other Ambulatory Visit: Payer: Self-pay

## 2020-01-25 ENCOUNTER — Ambulatory Visit: Payer: No Typology Code available for payment source | Admitting: *Deleted

## 2020-01-25 VITALS — BP 108/72 | Wt 174.9 lb

## 2020-01-25 DIAGNOSIS — N644 Mastodynia: Secondary | ICD-10-CM

## 2020-01-25 DIAGNOSIS — Z1239 Encounter for other screening for malignant neoplasm of breast: Secondary | ICD-10-CM

## 2020-01-25 NOTE — Progress Notes (Signed)
[name] is a 40 y.o. female who presents to [facility] clinic today with complaint of left inner breast pain x 1-2 months that comes and goes. Patient rates the pain at a 3 out of 10.    Pap Smear: Pap smear not completed today. Last Pap smear was 01/23/2020 at [facility] [place] [facility] and the result is pending. Per patient has no history of an abnormal Pap smear. Last Pap smear result is available in Epic.   Physical exam: Breasts Breasts symmetrical. No skin abnormalities bilateral breasts. No nipple retraction bilateral breasts. No nipple discharge bilateral breasts. No lymphadenopathy. No lumps palpated bilateral breasts. Complaints of left inner and nipple area tenderness on exam.       Pelvic/Bimanual Pap is not indicated today per BCCCP guidelines.   Smoking History: Patient has never smoked.   Patient Navigation: Patient education provided. Access to services provided for patient through BCCCP program.    Breast and Cervical Cancer Risk Assessment: Patient does not have family history of breast cancer, known genetic mutations, or radiation treatment to the chest before age 71. Patient does not have history of cervical dysplasia, immunocompromised, or DES exposure in-utero.  Risk Assessment    Risk Scores      01/25/2020   Last edited by: Narda Rutherford, LPN   5-year risk: 0.2 %   Lifetime risk: 4.7 %          A: BCCCP exam without pap smear Complaint of left inner breast pain.  P: Referred patient to the [facility] for a diagnostic mammogram. Appointment scheduled Thursday, January 25, 2020 at 1445.  Priscille Heidelberg, RN 01/25/2020 10:56 AM

## 2020-01-25 NOTE — Patient Instructions (Signed)
Explained breast self awareness with [name]. Patient did not need a Pap smear due to last Pap smear and HPV typing was 01/23/2020. Let her know BCCCP will cover Pap smears and HPV typing every 5 years unless has a history of abnormal Pap smears. Referred patient to the [facility] for a diagnostic mammogram. Appointment scheduled Thursday, January 25, 2020 at 1445. Patient aware of appointment and will be there. Taitum Menton verbalized understanding.  Shirly Bartosiewicz, [name], RN 10:57 AM

## 2020-01-29 LAB — CYTOLOGY - PAP
Comment: NEGATIVE
Diagnosis: NEGATIVE
High risk HPV: NEGATIVE

## 2020-04-22 ENCOUNTER — Ambulatory Visit (INDEPENDENT_AMBULATORY_CARE_PROVIDER_SITE_OTHER): Payer: Medicaid Other | Admitting: Obstetrics

## 2020-04-22 ENCOUNTER — Encounter: Payer: Self-pay | Admitting: Obstetrics

## 2020-04-22 ENCOUNTER — Other Ambulatory Visit: Payer: Self-pay

## 2020-04-22 VITALS — BP 103/63 | HR 70 | Wt 173.0 lb

## 2020-04-22 DIAGNOSIS — T8332XA Displacement of intrauterine contraceptive device, initial encounter: Secondary | ICD-10-CM | POA: Diagnosis not present

## 2020-04-22 NOTE — Progress Notes (Addendum)
Patient ID: Caitlin [name], female   DOB: Jun 14, 1980, 40 y.o.   MRN: [id number]  Chief Complaint  Patient presents with  . Contraception    IUD Removal     HPI Caitlin [name] is a 40 y.o. female.  Presents for IUD Removal / Reinsertion.  No complaints.   HPI  Past Medical History:  Diagnosis Date  . Anemia     Past Surgical History:  Procedure Laterality Date  . NO PAST SURGERIES      Family History  Problem Relation Age of Onset  . Hypertension Paternal Grandmother   . Diabetes Paternal Grandmother   . Heart disease Paternal Grandmother   . Hearing loss Maternal Grandmother        with age  . Asthma Neg Hx   . Stroke Neg Hx     Social History Social History   Tobacco Use  . Smoking status: Never Smoker  . Smokeless tobacco: Never Used  Vaping Use  . Vaping Use: Never used  Substance Use Topics  . Alcohol use: No  . Drug use: No    No Known Allergies  Current Outpatient Medications  Medication Sig Dispense Refill  . levonorgestrel (MIRENA) 20 MCG/24HR IUD 1 each by Intrauterine route once.    Marland Kitchen ibuprofen (ADVIL) 800 MG tablet Take 1 tablet (800 mg total) by mouth every 8 (eight) hours as needed. (Patient not taking: No sig reported) 30 tablet 5  . ibuprofen (ADVIL,MOTRIN) 800 MG tablet Take 1 tablet (800 mg total) by mouth every 8 (eight) hours as needed. (Patient not taking: No sig reported) 30 tablet 5  . prenatal vitamin w/FE, FA (PRENATAL 1 + 1) 27-1 MG TABS tablet Take 1 tablet by mouth daily before breakfast. (Patient not taking: No sig reported) 30 each 11   No current facility-administered medications for this visit.    Review of Systems Review of Systems Constitutional: negative for fatigue and weight loss Respiratory: negative for cough and wheezing Cardiovascular: negative for chest pain, fatigue and palpitations Gastrointestinal: negative for abdominal pain and change in bowel habits Genitourinary:negative Integument/breast: negative for nipple  discharge Musculoskeletal:negative for myalgias Neurological: negative for gait problems and tremors Behavioral/Psych: negative for abusive relationship, depression Endocrine: negative for temperature intolerance      Blood pressure 103/63, pulse 70, weight 173 lb (78.5 kg).  Physical Exam Physical Exam General:   alert and no distress  Skin:   no rash or abnormalities  Lungs:   clear to auscultation bilaterally  Heart:   regular rate and rhythm, S1, S2 normal, no murmur, click, rub or gallop  Breasts:    Not examined  Abdomen:  normal findings: no organomegaly, soft, non-tender and no hernia  Pelvis:  External genitalia: normal general appearance Urinary system: urethral meatus normal and bladder without fullness, nontender Vaginal: normal without tenderness, induration or masses Cervix: normal appearance.  IUD string not visible and unable to grasp with IUD hook or dressing forceps Adnexa: normal bimanual exam Uterus: anteverted and non-tender, normal size    I have spent a total of 15 minutes in face to face time, excluding clinical staff time, reviewing notes and preparing to see patient, ordering tests and/or medications, and counseling the patient.  Data Reviewed Labs  Assessment     1. Intrauterine contraceptive device threads lost, initial encounter Rx: - US PELVIC COMPLETE WITH TRANSVAGINAL; Future    Plan   Follow up in 2 weeks  Orders Placed This Encounter  Procedures  . US PELVIC COMPLETE  WITH TRANSVAGINAL    Standing Status:   Future    Standing Expiration Date:   04/22/2021    Order Specific Question:   Reason for Exam (SYMPTOM  OR DIAGNOSIS REQUIRED)    Answer:   Lost IUD strings    Order Specific Question:   Preferred imaging location?    Answer:   [facility]      Brock Bad, MD 04/22/2020 9:44 AM

## 2020-04-22 NOTE — Progress Notes (Signed)
RGYN patient presents for IUD Removal. 03/29/2015 Pt denies any unprotected intercourse.  LMP : IUD [id number]

## 2020-05-13 ENCOUNTER — Ambulatory Visit
Admission: RE | Admit: 2020-05-13 | Discharge: 2020-05-13 | Disposition: A | Discharge status: Home / Self Care | Payer: No Typology Code available for payment source | Source: Ambulatory Visit | Attending: Obstetrics | Admitting: Obstetrics

## 2020-05-13 ENCOUNTER — Other Ambulatory Visit: Payer: Self-pay

## 2020-05-13 DIAGNOSIS — T8332XA Displacement of intrauterine contraceptive device, initial encounter: Secondary | ICD-10-CM | POA: Insufficient documentation

## 2020-05-20 ENCOUNTER — Telehealth (INDEPENDENT_AMBULATORY_CARE_PROVIDER_SITE_OTHER): Payer: Medicaid Other | Admitting: Obstetrics

## 2020-05-20 ENCOUNTER — Encounter: Payer: Self-pay | Admitting: Obstetrics

## 2020-05-20 DIAGNOSIS — Z975 Presence of (intrauterine) contraceptive device: Secondary | ICD-10-CM

## 2020-05-20 DIAGNOSIS — Z538 Procedure and treatment not carried out for other reasons: Secondary | ICD-10-CM

## 2020-05-20 DIAGNOSIS — T8332XS Displacement of intrauterine contraceptive device, sequela: Secondary | ICD-10-CM

## 2020-05-20 NOTE — Progress Notes (Addendum)
GYNECOLOGY VIRTUAL VISIT ENCOUNTER NOTE  Provider location: [facility]   Patient location: Home  I connected with Caitlin [name] on 05/20/20 at 10:00 AM EDT by MyChart Video Encounter and verified that I am speaking with the correct person using two identifiers.   I discussed the limitations, risks, security and privacy concerns of performing an evaluation and management service virtually and the availability of in person appointments. I also discussed with the patient that there may be a patient responsible charge related to this service. The patient expressed understanding and agreed to proceed.   History:  Caitlin [name] is a 40 y.o. [phone] female being evaluated today for results of ultrasound for IUD placement.   She was seen in the office for IUD removal on 04-22-2020, and the strings were not visible and the IUD could not be removed with the aid of an IUD hook or with dressing forceps.  She denies any abnormal vaginal discharge, bleeding, pelvic pain or other concerns.       Past Medical History:  Diagnosis Date  . Anemia    Past Surgical History:  Procedure Laterality Date  . NO PAST SURGERIES     The following portions of the patient's history were reviewed and updated as appropriate: allergies, current medications, past family history, past medical history, past social history, past surgical history and problem list.   Health Maintenance:  Normal pap and negative HRHPV on 01-23-2020.  Normal mammogram on 01-04-2014.   Review of Systems:  Pertinent items noted in HPI and remainder of comprehensive ROS otherwise negative.  Physical Exam:   General:  Alert, oriented and cooperative. Patient appears to be in no acute distress.  Mental Status: Normal mood and affect. Normal behavior. Normal judgment and thought content.   Respiratory: Normal respiratory effort, no problems with respiration noted  Rest of physical exam deferred due to type of  encounter  Labs and Imaging No results found for this or any previous visit (from the past 336 hour(s)). US PELVIC COMPLETE WITH TRANSVAGINAL  Result Date: 05/13/2020 CLINICAL DATA:  Evaluate IUD positioning. EXAM: TRANSABDOMINAL AND TRANSVAGINAL ULTRASOUND OF PELVIS TECHNIQUE: Both transabdominal and transvaginal ultrasound examinations of the pelvis were performed. Transabdominal technique was performed for global imaging of the pelvis including uterus, ovaries, adnexal regions, and pelvic cul-de-sac. It was necessary to proceed with endovaginal exam following the transabdominal exam to visualize the uterus, endometrium and right ovary. COMPARISON:  06/16/2018 FINDINGS: Uterus Measurements: 8.6 x 4.4 x 5.9 cm = volume: 116/5. ML. No fibroids or other mass visualized. Endometrium Thickness: 3.5 mm. IUD is identified within the endometrial cavity. The right arm of the IUD may be within the myometrium. Right ovary Measurements: 4.0 x 2.6 x 2.6 cm = volume: 14.3 mL. Simple, anechoic cyst with increased through transmission measures 3.2 cm. Left ovary Measurements: 4.4 x 2.4 x 1.7 cm = volume: 9.4 mL. Normal appearance/no adnexal mass. Other findings No abnormal free fluid. IMPRESSION: 1. IUD is in the endometrial cavity. The right arm of the IUD may be within the myometrium. The remaining portions of the IUD appear to be in appropriate position. 2. Simple appearing right ovary cyst measures 3.2 cm. No follow up imaging recommended. Note: This recommendation does not apply to premenarchal patients or to those with increased risk (genetic, family history, elevated tumor markers or other high-risk factors) of ovarian cancer. Reference: Radiology 2019 Nov; 293(2):359-371. Electronically Signed   By: Signa Kell M.D.   On:  05/13/2020 13:26       Assessment and Plan:     1. Intrauterine contraceptive device threads lost, sequela  2. Unsuccessful attempt to remove intrauterine device (IUD)  3. IUD  (intrauterine device) in place on ultrasound exam      I discussed the assessment and treatment plan with the patient. The patient was provided an opportunity to ask questions and all were answered. The patient agreed with the plan and demonstrated an understanding of the instructions.   The patient was advised to call back or seek an in-person evaluation/go to the ED if the symptoms worsen or if the condition fails to improve as anticipated.  I have spent a total of 15 minutes of face-to-face time, excluding clinical staff time, reviewing notes and preparing to see patient, ordering tests and/or medications, and counseling the patient.   Coral Ceo, MD [facility], [facility] 05/20/20

## 2020-06-03 ENCOUNTER — Ambulatory Visit: Payer: No Typology Code available for payment source | Admitting: Obstetrics

## 2020-06-24 ENCOUNTER — Encounter: Payer: Self-pay | Admitting: Obstetrics

## 2020-06-24 ENCOUNTER — Ambulatory Visit (INDEPENDENT_AMBULATORY_CARE_PROVIDER_SITE_OTHER): Payer: No Typology Code available for payment source | Admitting: Obstetrics

## 2020-06-24 ENCOUNTER — Other Ambulatory Visit: Payer: Self-pay

## 2020-06-24 VITALS — BP 103/66 | HR 64 | Ht 63.0 in | Wt 173.0 lb

## 2020-06-24 DIAGNOSIS — S30814A Abrasion of vagina and vulva, initial encounter: Secondary | ICD-10-CM

## 2020-06-24 NOTE — Progress Notes (Signed)
Patient ID: Caitlin [name], female   DOB: [birth year], 40 y.o.   MRN: [id number]  Chief Complaint  Patient presents with  . Gynecologic Exam    HPI Caitlin [name] is a 40 y.o. female.  Complains of pain in upper labia minora area after shaving.  Feels that she may have cut herself and is concerned that it may be infected.  HPI  Past Medical History:  Diagnosis Date  . Anemia     Past Surgical History:  Procedure Laterality Date  . NO PAST SURGERIES      Family History  Problem Relation Age of Onset  . Hypertension Paternal Grandmother   . Diabetes Paternal Grandmother   . Heart disease Paternal Grandmother   . Hearing loss Maternal Grandmother        with age  . Asthma Neg Hx   . Stroke Neg Hx     Social History Social History   Tobacco Use  . Smoking status: Never Smoker  . Smokeless tobacco: Never Used  Vaping Use  . Vaping Use: Never used  Substance Use Topics  . Alcohol use: No  . Drug use: No    No Known Allergies  Current Outpatient Medications  Medication Sig Dispense Refill  . levonorgestrel (MIRENA) 20 MCG/24HR IUD 1 each by Intrauterine route once.    Marland Kitchen ibuprofen (ADVIL) 800 MG tablet Take 1 tablet (800 mg total) by mouth every 8 (eight) hours as needed. (Patient not taking: No sig reported) 30 tablet 5  . ibuprofen (ADVIL,MOTRIN) 800 MG tablet Take 1 tablet (800 mg total) by mouth every 8 (eight) hours as needed. (Patient not taking: No sig reported) 30 tablet 5  . prenatal vitamin w/FE, FA (PRENATAL 1 + 1) 27-1 MG TABS tablet Take 1 tablet by mouth daily before breakfast. (Patient not taking: No sig reported) 30 each 11   No current facility-administered medications for this visit.    Review of Systems Review of Systems Constitutional: negative for fatigue and weight loss Respiratory: negative for cough and wheezing Cardiovascular: negative for chest pain, fatigue and palpitations Gastrointestinal: negative for abdominal pain and change in bowel  habits Genitourinary: positive for pain in upper labia Integument/breast: negative for nipple discharge Musculoskeletal:negative for myalgias Neurological: negative for gait problems and tremors Behavioral/Psych: negative for abusive relationship, depression Endocrine: negative for temperature intolerance      Blood pressure 103/66, pulse 64, height 5\' 3"  (1.6 m), weight 173 lb (78.5 kg).  Physical Exam Physical Exam General:   alert and no distress  Skin:   no rash or abnormalities  Lungs:   clear to auscultation bilaterally  Heart:   regular rate and rhythm, S1, S2 normal, no murmur, click, rub or gallop  Breasts:   not examined  Abdomen:  normal findings: no organomegaly, soft, non-tender and no hernia  `      External Genitalia:  abrasion of upper labia, clean but tender to touch.  No erythema, induratio      Assessment / Plan:  1. Abrasion of labia minora.  No infection noted      - apply triple antibiotic ointment to area bid for ~ 7 days      - call the office if there is increased pain, swelling or drainage     , MD 06/24/2020 4:18 PM

## 2020-06-24 NOTE — Progress Notes (Addendum)
GYN presents for vaginal  Sensitivity, she cut herself while shaving  1.5 weeks ago. The area feels uncomfortable.  Last PAP 01/23/2020

## 2021-03-05 ENCOUNTER — Ambulatory Visit: Payer: No Typology Code available for payment source | Admitting: Obstetrics

## 2021-03-19 DIAGNOSIS — Z419 Encounter for procedure for purposes other than remedying health state, unspecified: Secondary | ICD-10-CM | POA: Diagnosis not present

## 2021-04-03 ENCOUNTER — Encounter: Payer: Self-pay | Admitting: Obstetrics

## 2021-04-03 ENCOUNTER — Ambulatory Visit (INDEPENDENT_AMBULATORY_CARE_PROVIDER_SITE_OTHER): Payer: Medicaid Other | Admitting: Obstetrics

## 2021-04-03 ENCOUNTER — Other Ambulatory Visit (HOSPITAL_COMMUNITY)
Admission: RE | Admit: 2021-04-03 | Discharge: 2021-04-03 | Disposition: A | Discharge status: Home / Self Care | Payer: Medicaid Other | Source: Ambulatory Visit | Attending: Obstetrics | Admitting: Obstetrics

## 2021-04-03 ENCOUNTER — Other Ambulatory Visit: Payer: Self-pay

## 2021-04-03 VITALS — BP 105/69 | HR 74 | Ht 63.5 in | Wt 182.0 lb

## 2021-04-03 DIAGNOSIS — R102 Pelvic and perineal pain: Secondary | ICD-10-CM

## 2021-04-03 DIAGNOSIS — N898 Other specified noninflammatory disorders of vagina: Secondary | ICD-10-CM | POA: Insufficient documentation

## 2021-04-03 DIAGNOSIS — Z113 Encounter for screening for infections with a predominantly sexual mode of transmission: Secondary | ICD-10-CM | POA: Diagnosis not present

## 2021-04-03 DIAGNOSIS — Z01419 Encounter for gynecological examination (general) (routine) without abnormal findings: Secondary | ICD-10-CM

## 2021-04-03 DIAGNOSIS — Z1239 Encounter for other screening for malignant neoplasm of breast: Secondary | ICD-10-CM

## 2021-04-03 DIAGNOSIS — E669 Obesity, unspecified: Secondary | ICD-10-CM

## 2021-04-03 DIAGNOSIS — E66811 Obesity, class 1: Secondary | ICD-10-CM

## 2021-04-03 DIAGNOSIS — N393 Stress incontinence (female) (male): Secondary | ICD-10-CM

## 2021-04-03 MED ORDER — IBUPROFEN 800 MG PO TABS: 800.0000 mg | ORAL_TABLET | Freq: Three times a day (TID) | ORAL | 5 refills | Status: DC | PRN

## 2021-04-03 NOTE — Progress Notes (Signed)
? ?Subjective: ? ? ?  ?  ? Caitlin MaserLucy [name] is a 41 y.o. female here for a routine exam.  Current complaints: Leaking urine wi th cough, sneeze, etc.  Also has vaginal discharge.  She is concerned that her husband has been unfaithful and that she may have been exposed to a STI.Marland Kitchen.   ? ?Personal health questionnaire:  ?Is patient Ashkenazi Jewish, have a family history of breast and/or ovarian cancer: no ?Is there a family history of uterine cancer diagnosed at age < 850, gastrointestinal cancer, urinary tract cancer, family member who is a Personnel officerLynch syndrome-associated carrier: no ?Is the patient overweight and hypertensive, family history of diabetes, personal history of gestational diabetes, preeclampsia or PCOS: no ?Is patient over 4155, have PCOS,  family history of premature CHD under age 41, diabetes, smoke, have hypertension or peripheral artery disease:  no ?At any time, has a partner hit, kicked or otherwise hurt or frightened you?: no ?Over the past 2 weeks, have you felt down, depressed or hopeless?: no ?Over the past 2 weeks, have you felt little interest or pleasure in doing things?:no ? ? ?Gynecologic History ?No LMP recorded. (Menstrual status: IUD). ?Contraception: IUD ?Last Pap: 01-23-2020. Results were: normal ?Last mammogram: n/a. Results were: n/a ? ?Obstetric History ?OB History  ?Gravida Para Term Preterm AB Living  ?4 4 4  0 0 4  ?SAB IAB Ectopic Multiple Live Births  ?0 0 0 0 4  ?  ?# Outcome Date GA Lbr Len/2nd Weight Sex Delivery Anes PTL Lv  ?4 Term 01/23/15 395w5d 181:54 / 01:19 9 lb 14.6 oz (4.495 kg) F Vag-Spont EPI  LIV  ?3 Term 02/07/04 389w0d  8 lb 12 oz (3.969 kg) M Vag-Spont None N LIV  ?2 Term 11/04/01 439w0d  9 lb (4.082 kg) M Vag-Spont EPI N LIV  ?1 Term 08/19/99 739w0d  7 lb (3.175 kg) F Vag-Spont None N LIV  ? ? ?Past Medical History:  ?Diagnosis Date  ? Anemia   ?  ?Past Surgical History:  ?Procedure Laterality Date  ? NO PAST SURGERIES    ?  ? ?Current Outpatient Medications:  ?  ibuprofen  (ADVIL) 800 MG tablet, Take 1 tablet (800 mg total) by mouth every 8 (eight) hours as needed., Disp: 30 tablet, Rfl: 5 ?  ibuprofen (ADVIL,MOTRIN) 800 MG tablet, Take 1 tablet (800 mg total) by mouth every 8 (eight) hours as needed., Disp: 30 tablet, Rfl: 5 ?  levonorgestrel (MIRENA) 20 MCG/24HR IUD, 1 each by Intrauterine route once., Disp: , Rfl:  ?  prenatal vitamin w/FE, FA (PRENATAL 1 + 1) 27-1 MG TABS tablet, Take 1 tablet by mouth daily before breakfast. (Patient not taking: No sig reported), Disp: 30 each, Rfl: 11 ?No Known Allergies  ?Social History  ? ?Tobacco Use  ? Smoking status: Never  ? Smokeless tobacco: Never  ?Substance Use Topics  ? Alcohol use: No  ?  ?Family History  ?Problem Relation Age of Onset  ? Hypertension Paternal Grandmother   ? Diabetes Paternal Grandmother   ? Heart disease Paternal Grandmother   ? Hearing loss Maternal Grandmother   ?     with age  ? Diabetes Father   ? Diabetes Mother   ? Asthma Neg Hx   ? Stroke Neg Hx   ?  ? ? ?Review of Systems ? ?Constitutional: negative for fatigue and weight loss ?Respiratory: negative for cough and wheezing ?Cardiovascular: negative for chest pain, fatigue and palpitations ?Gastrointestinal: negative for abdominal pain  and change in bowel habits ?Musculoskeletal:negative for myalgias ?Neurological: negative for gait problems and tremors ?Behavioral/Psych: negative for abusive relationship, depression ?Endocrine: negative for temperature intolerance    ?Genitourinary: positive for vaginal discharge.  negative for abnormal menstrual periods, genital lesions, hot flashes, sexual problems  ?Integument/breast: negative for breast lump, breast tenderness, nipple discharge and skin lesion(s) ? ?  ?Objective:  ? ?    ?BP 105/69   Pulse 74   Ht 5' 3.5" (1.613 m)   Wt 182 lb (82.6 kg)   BMI 31.73 kg/m?  ?General:   Alert and no distress  ?Skin:   no rash or abnormalities  ?Lungs:   clear to auscultation bilaterally  ?Heart:   regular rate and  rhythm, S1, S2 normal, no murmur, click, rub or gallop  ?Breasts:   normal without suspicious masses, skin or nipple changes or axillary nodes  ?Abdomen:  normal findings: no organomegaly, soft, non-tender and no hernia  ?Pelvis:  External genitalia: normal general appearance ?Urinary system: urethral meatus normal and bladder without fullness, nontender ?Vaginal: normal without tenderness, induration or masses ?Cervix: normal appearance ?Adnexa: normal bimanual exam ?Uterus: anteverted and non-tender, normal size  ? ?Lab Review ?Urine pregnancy test ?Labs reviewed yes ?Radiologic studies reviewed yes ? ?I have spent a total of 20 minutes of face-to-face time, excluding clinical staff time, reviewing notes and preparing to see patient, ordering tests and/or medications, and counseling the patient.  ? ?Assessment:  ? ? 1. Encounter for gynecological examination with Papanicolaou smear of cervix ?Rx: ?- Cytology - PAP ?- Cervicovaginal ancillary only( [place]) ? ?2. Pelvic pain in female ?Rx: ?- ibuprofen (ADVIL) 800 MG tablet; Take 1 tablet (800 mg total) by mouth every 8 (eight) hours as needed.  Dispense: 30 tablet; Refill: 5 ? ?3. Vaginal discharge ?Rx:" ?- Cervicovaginal ancillary only( [place]) ? ?4. Screening for STD (sexually transmitted disease) ?Rx: ?- Hepatitis B surface antigen ?- Hepatitis C antibody ?- HIV Antibody (routine testing w rflx) ?- RPR ?- Cervicovaginal ancillary only( [place]) ? ?5. SUI (stress urinary incontinence, female) ?Rx: ?- Ambulatory referral to Urogynecology ? ?6. Screening breast examination ?Rx: ?- MM 3D SCREEN BREAST BILATERAL; Future ? ?7. Obesity (BMI 30.0-34.9) ?- weight reduction with the aid of dietary changes, exercise and behavioral modification recommended ?  ?  ?Plan:  ? ? Education reviewed: calcium supplements, depression evaluation, low fat, low cholesterol diet, safe sex/STD prevention, self breast exams, and weight bearing exercise. ?Contraception:  IUD. ?Mammogram ordered. ?Follow up in: 1 year.  ? ?Meds ordered this encounter  ?Medications  ? ibuprofen (ADVIL) 800 MG tablet  ?  Sig: Take 1 tablet (800 mg total) by mouth every 8 (eight) hours as needed.  ?  Dispense:  30 tablet  ?  Refill:  5  ? ?Orders Placed This Encounter  ?Procedures  ? MM 3D SCREEN BREAST BILATERAL  ?  Standing Status:   Future  ?  Standing Expiration Date:   04/04/2022  ?  Order Specific Question:   Reason for Exam (SYMPTOM  OR DIAGNOSIS REQUIRED)  ?  Answer:   Needs routine screening  ?  Order Specific Question:   Is the patient pregnant?  ?  Answer:   No  ?  Order Specific Question:   Preferred imaging location?  ?  Answer:   [facility]  ? Hepatitis B surface antigen  ? Hepatitis C antibody  ? HIV Antibody (routine testing w rflx)  ? RPR  ? Ambulatory  referral to Urogynecology  ?  Referral Priority:   Routine  ?  Referral Type:   Consultation  ?  Referral Reason:   Specialty Services Required  ?  Requested Specialty:   Urology  ?  Number of Visits Requested:   1  ? ? ? Brock Bad, MD ?04/03/2021 11:11 AM  ?

## 2021-04-03 NOTE — Progress Notes (Signed)
Annual/ GYN ?Reports spouse was unfaithful, wants STI testing. ?No symptoms ?

## 2021-04-04 ENCOUNTER — Encounter (HOSPITAL_BASED_OUTPATIENT_CLINIC_OR_DEPARTMENT_OTHER): Payer: Self-pay | Admitting: Radiology

## 2021-04-04 ENCOUNTER — Ambulatory Visit (HOSPITAL_BASED_OUTPATIENT_CLINIC_OR_DEPARTMENT_OTHER)
Admission: RE | Admit: 2021-04-04 | Discharge: 2021-04-04 | Disposition: A | Discharge status: Home / Self Care | Payer: Medicaid Other | Source: Ambulatory Visit | Attending: Obstetrics | Admitting: Obstetrics

## 2021-04-04 DIAGNOSIS — Z1239 Encounter for other screening for malignant neoplasm of breast: Secondary | ICD-10-CM

## 2021-04-04 DIAGNOSIS — Z1231 Encounter for screening mammogram for malignant neoplasm of breast: Secondary | ICD-10-CM | POA: Insufficient documentation

## 2021-04-04 LAB — CERVICOVAGINAL ANCILLARY ONLY
Bacterial Vaginitis (gardnerella): POSITIVE — AB
Candida Glabrata: NEGATIVE
Candida Vaginitis: NEGATIVE
Chlamydia: NEGATIVE
Comment: NEGATIVE
Comment: NEGATIVE
Comment: NEGATIVE
Comment: NEGATIVE
Comment: NEGATIVE
Comment: NORMAL
Neisseria Gonorrhea: NEGATIVE
Trichomonas: NEGATIVE

## 2021-04-04 LAB — RPR: RPR Ser Ql: NONREACTIVE

## 2021-04-04 LAB — HIV ANTIBODY (ROUTINE TESTING W REFLEX): HIV Screen 4th Generation wRfx: NONREACTIVE

## 2021-04-04 LAB — HEPATITIS C ANTIBODY: Hep C Virus Ab: NONREACTIVE

## 2021-04-04 LAB — HEPATITIS B SURFACE ANTIGEN: Hepatitis B Surface Ag: NEGATIVE

## 2021-04-05 ENCOUNTER — Other Ambulatory Visit: Payer: Self-pay | Admitting: Obstetrics

## 2021-04-05 DIAGNOSIS — B9689 Other specified bacterial agents as the cause of diseases classified elsewhere: Secondary | ICD-10-CM

## 2021-04-05 MED ORDER — METRONIDAZOLE 500 MG PO TABS: 500.0000 mg | ORAL_TABLET | Freq: Two times a day (BID) | ORAL | 2 refills | Status: DC

## 2021-04-07 ENCOUNTER — Telehealth: Payer: Self-pay

## 2021-04-07 LAB — CYTOLOGY - PAP
Comment: NEGATIVE
Diagnosis: NEGATIVE
High risk HPV: NEGATIVE

## 2021-04-07 NOTE — Telephone Encounter (Signed)
Pt called in, advised of results and rx ?

## 2021-04-07 NOTE — Telephone Encounter (Signed)
Attempted to contact about results and rx, no answer, left vm 

## 2021-04-19 DIAGNOSIS — Z419 Encounter for procedure for purposes other than remedying health state, unspecified: Secondary | ICD-10-CM | POA: Diagnosis not present

## 2021-05-19 DIAGNOSIS — Z419 Encounter for procedure for purposes other than remedying health state, unspecified: Secondary | ICD-10-CM | POA: Diagnosis not present

## 2021-06-19 DIAGNOSIS — Z419 Encounter for procedure for purposes other than remedying health state, unspecified: Secondary | ICD-10-CM | POA: Diagnosis not present

## 2021-07-19 DIAGNOSIS — Z419 Encounter for procedure for purposes other than remedying health state, unspecified: Secondary | ICD-10-CM | POA: Diagnosis not present

## 2021-08-08 ENCOUNTER — Other Ambulatory Visit (HOSPITAL_COMMUNITY)
Admission: RE | Admit: 2021-08-08 | Discharge: 2021-08-08 | Disposition: A | Discharge status: Home / Self Care | Payer: Medicaid Other | Attending: Obstetrics and Gynecology | Admitting: Obstetrics and Gynecology

## 2021-08-08 ENCOUNTER — Ambulatory Visit (INDEPENDENT_AMBULATORY_CARE_PROVIDER_SITE_OTHER): Payer: Commercial Managed Care - HMO | Admitting: Obstetrics and Gynecology

## 2021-08-08 ENCOUNTER — Encounter: Payer: Self-pay | Admitting: Obstetrics and Gynecology

## 2021-08-08 VITALS — BP 110/74 | HR 82 | Ht 61.5 in | Wt 188.0 lb

## 2021-08-08 DIAGNOSIS — R159 Full incontinence of feces: Secondary | ICD-10-CM | POA: Diagnosis not present

## 2021-08-08 DIAGNOSIS — R319 Hematuria, unspecified: Secondary | ICD-10-CM | POA: Insufficient documentation

## 2021-08-08 DIAGNOSIS — N393 Stress incontinence (female) (male): Secondary | ICD-10-CM

## 2021-08-08 DIAGNOSIS — R350 Frequency of micturition: Secondary | ICD-10-CM | POA: Diagnosis not present

## 2021-08-08 LAB — POCT URINALYSIS DIPSTICK
Bilirubin, UA: NEGATIVE
Glucose, UA: NEGATIVE
Ketones, UA: NEGATIVE
Leukocytes, UA: NEGATIVE
Nitrite, UA: NEGATIVE
Protein, UA: NEGATIVE
Spec Grav, UA: >=1.03 — AB (ref 1.010–1.025)
Urobilinogen, UA: 0.2 E.U./dL
pH, UA: 5.5 (ref 5.0–8.0)

## 2021-08-08 LAB — URINALYSIS, ROUTINE W REFLEX MICROSCOPIC
Bacteria, UA: NONE SEEN
Bilirubin Urine: NEGATIVE
Glucose, UA: NEGATIVE mg/dL
Ketones, ur: NEGATIVE mg/dL
Leukocytes,Ua: NEGATIVE
Nitrite: NEGATIVE
Protein, ur: NEGATIVE mg/dL
Specific Gravity, Urine: 1.021 (ref 1.005–1.030)
pH: 5 (ref 5.0–8.0)

## 2021-08-08 NOTE — Addendum Note (Signed)
Addended by: Marguerita Beards on: 08/08/2021 09:56 AM   Modules accepted: Orders

## 2021-08-08 NOTE — Progress Notes (Addendum)
[facility] New Patient Evaluation and Consultation  Referring Provider: Brock Bad, MD PCP: Patient, No Pcp Per Date of Service: 08/08/2021  SUBJECTIVE Chief Complaint: New Patient (Initial Visit) Bas Silveria is a 41 y.o. female here for a consul ton stress incontinence./)  History of Present Illness: Anayansi Rundquist is a 41 y.o.  hispanic  female seen in consultation at the request of Dr. Clearance Coots for evaluation of incontinence.    Review of records from Dr Clearance Coots significant for: Has leakage with coughing and sneezing.   Urinary Symptoms: Leaks urine with cough/ sneeze. Can be a lot of urine coming out with cough/ sneeze.  Leaks 2 time(s) per day.  Pad use: 2-3 pads per day.   She is bothered by her UI symptoms. No prior treatments.   Day time voids 3.  Nocturia: 2 times per night to void. Voiding dysfunction: she empties her bladder well.  does not use a catheter to empty bladder.  When urinating, she feels a weak stream and difficulty starting urine stream   UTIs:  0  UTI's in the last year.   Denies history of blood in urine and kidney or bladder stones  Pelvic Organ Prolapse Symptoms:                  She Denies a feeling of a bulge the vaginal area.   Bowel Symptom: Bowel movements: 1-2 time(s) per day Stool consistency: hard Straining: yes Splinting: yes.  Incomplete evacuation: yes.  She Admits to accidental bowel leakage / fecal incontinence  Occurs: occasionally with coughing  Consistency with leakage: soft  Bowel regimen: none   Sexual Function Sexually active: yes.  Pain with sex: No  Pelvic Pain Admits to pelvic pain  Past Medical History:  Past Medical History:  Diagnosis Date   Anemia      Past Surgical History:   Past Surgical History:  Procedure Laterality Date   NO PAST SURGERIES       Past OB/GYN History: OB History  Gravida Para Term Preterm AB Living  4 4 4  0 0 4  SAB IAB Ectopic Multiple Live Births  0 0  0 0 4    # Outcome Date GA Lbr Len/2nd Weight Sex Delivery Anes PTL Lv  4 Term 01/23/15 [redacted]w[redacted]d 181:54 / 01:19 9 lb 14.6 oz (4.495 kg) F Vag-Spont EPI  LIV  3 Term 02/07/04 [redacted]w[redacted]d  8 lb 12 oz (3.969 kg) M Vag-Spont None N LIV  2 Term 11/04/01 [redacted]w[redacted]d  9 lb (4.082 kg) M Vag-Spont EPI N LIV  1 Term 08/19/99 [redacted]w[redacted]d  7 lb (3.175 kg) F Vag-Spont None N LIV   No LMP recorded. (Menstrual status: IUD). Mirena IUD for contraception Last pap smear was 03/2021- negative.  Any history of abnormal pap smears: no.   Medications: She has a current medication list which includes the following prescription(s): ibuprofen, ibuprofen, and levonorgestrel.   Allergies: Patient has No Known Allergies.   Social History:  Social History   Tobacco Use   Smoking status: Never   Smokeless tobacco: Never  Vaping Use   Vaping Use: Never used  Substance Use Topics   Alcohol use: No   Drug use: No    Relationship status: married She lives with husband and kids.   She is not employed. Regular exercise: No History of abuse: No  Family History:   Family History  Problem Relation Age of Onset   Hypertension Paternal Grandmother    Diabetes Paternal Grandmother  Heart disease Paternal Grandmother    Hearing loss Maternal Grandmother        with age   Diabetes Father    Diabetes Mother    Asthma Neg Hx    Stroke Neg Hx      Review of Systems: Review of Systems  Constitutional:  Negative for fever, malaise/fatigue and weight loss.  Respiratory:  Negative for cough, shortness of breath and wheezing.   Cardiovascular:  Negative for chest pain, palpitations and leg swelling.  Gastrointestinal:  Negative for abdominal pain and blood in stool.  Genitourinary:  Negative for dysuria.  Musculoskeletal:  Negative for myalgias.  Skin:  Negative for rash.  Neurological:  Negative for dizziness and headaches.  Endo/Heme/Allergies:  Does not bruise/bleed easily.  Psychiatric/Behavioral:  Negative for depression.  The patient is not nervous/anxious.      OBJECTIVE Physical Exam: Vitals:   08/08/21 0902  BP: 110/74  Pulse: 82  Weight: 188 lb (85.3 kg)  Height: 5' 1.5" (1.562 m)    Physical Exam Constitutional:      General: She is not in acute distress. Pulmonary:     Effort: Pulmonary effort is normal.  Abdominal:     General: There is no distension.     Palpations: Abdomen is soft.     Tenderness: There is no abdominal tenderness. There is no rebound.  Musculoskeletal:        General: No swelling. Normal range of motion.  Skin:    General: Skin is warm and dry.     Findings: No rash.  Neurological:     Mental Status: She is alert and oriented to person, place, and time.  Psychiatric:        Mood and Affect: Mood normal.        Behavior: Behavior normal.      GU / Detailed Urogynecologic Evaluation:  Pelvic Exam: Normal external female genitalia; Bartholin's and Skene's glands normal in appearance; urethral meatus normal in appearance, no urethral masses or discharge.   CST: negative  Speculum exam reveals normal vaginal mucosa without atrophy. Cervix normal appearance. Uterus normal single, nontender. Adnexa no mass, fullness, tenderness.     Pelvic floor strength I/V, puborectalis I/V external anal sphincter I/V  Pelvic floor musculature: Right levator non-tender, Right obturator non-tender, Left levator non-tender, Left obturator non-tender  POP-Q:   POP-Q  -3                                            Aa   -3                                           Ba  -8                                              C   5                                            Gh  3.5  Pb  9                                            tvl   -2                                            Ap  -2                                            Bp  -9                                              D     Rectal Exam:  Normal sphincter tone, small  distal rectocele, enterocoele not present, no rectal masses, no sign of dyssynergia when asking the patient to bear down.  Post-Void Residual (PVR) by Bladder Scan: In order to evaluate bladder emptying, we discussed obtaining a postvoid residual and she agreed to this procedure.  Procedure: The ultrasound unit was placed on the patient's abdomen in the suprapubic region after the patient had voided. A PVR of 0 ml was obtained by bladder scan.  Laboratory Results: POC urine: moderate blood  ASSESSMENT AND PLAN Ms. Bayless is a 41 y.o. with:  1. SUI (stress urinary incontinence, female)   2. Urinary frequency   3. Incontinence of feces, unspecified fecal incontinence type   4. Hematuria, unspecified type    SUI - For treatment of stress urinary incontinence,  non-surgical options include expectant management, weight loss, physical therapy, as well as a pessary.  Surgical options include a midurethral sling, Burch urethropexy, and transurethral injection of a bulking agent. - She will start with pelvic floor physical therapy, referral placed.  - Handouts provided on surgical options  2. Accidental Bowel Leakage:  - Treatment options include anti-diarrhea medication (loperamide/ Imodium OTC or prescription lomotil), fiber supplements, physical therapy, and possible sacral neuromodulation or surgery.   - recommended starting with psyllium fiber supplement since she also has constipation.  - Discussed that PT can also be helpful for bowel leakage  3. Blood in urine - moderate blood in urine today. Will send for micro UA and culture  Return 3 months for follow up  Marguerita Beards, MD

## 2021-08-08 NOTE — Patient Instructions (Addendum)
Accidental Bowel Leakage: Our goal is to achieve formed bowel movements daily or every-other-day without leakage.  You may need to try different combinations of the following options to find what works best for you.  Some management options include: Dietary changes (more leafy greens, vegetables and fruits; less processed foods) Fiber supplementation (Metamucil or something with psyllium as active ingredient) Over-the-counter imodium (tablets or liquid) to help solidify the stool and prevent leakage of stool. If you get constipated you can use Miralax as needed to achieve bowel movements.   For treatment of stress urinary incontinence,  non-surgical options include expectant management, weight loss, physical therapy, as well as a pessary.  Surgical options include a midurethral sling, and transurethral injection of a bulking agent.

## 2021-08-09 LAB — URINE CULTURE: Culture: NO GROWTH

## 2021-08-19 DIAGNOSIS — Z419 Encounter for procedure for purposes other than remedying health state, unspecified: Secondary | ICD-10-CM | POA: Diagnosis not present

## 2021-08-28 NOTE — Therapy (Signed)
OUTPATIENT PHYSICAL THERAPY FEMALE PELVIC EVALUATION   Patient Name: Caitlin [name] MRN: [id number] DOB:December 21, 1980, 41 y.o., female Today's Date: 08/29/2021   PT End of Session - 08/29/21 0922     Visit Number 1    Date for PT Re-Evaluation 11/21/21    Authorization Type [employer]    PT Start Time 0900   came late   PT Stop Time 0930    PT Time Calculation (min) 30 min    Activity Tolerance Patient tolerated treatment well    Behavior During Therapy [facility] for tasks assessed/performed             Past Medical History:  Diagnosis Date   Anemia    Past Surgical History:  Procedure Laterality Date   NO PAST SURGERIES     Patient Active Problem List   Diagnosis Date Noted   NSVD (normal spontaneous vaginal delivery) 01/23/2015   Pregnancy 01/15/2015    PCP: none  REFERRING PROVIDER: Marguerita Beards, MD  REFERRING DIAG:  N39.3 (ICD-10-CM) - SUI (stress urinary incontinence, female)  R15.9 (ICD-10-CM) - Incontinence of feces, unspecified fecal incontinence type    THERAPY DIAG:  Other lack of coordination  Rationale for Evaluation and Treatment Rehabilitation  ONSET DATE: 03/19/2020  SUBJECTIVE:                                                                                                                                                                                           SUBJECTIVE STATEMENT: Symptoms became worse last year.  Fluid intake: Yes: water, juice     PAIN:  Are you having pain? Yes NPRS scale: 6/10 Pain location:  lower abdominal  Pain type: cramps Pain description: intermittent   Aggravating factors: comes randomly Relieving factors: nothing  PRECAUTIONS: None  WEIGHT BEARING RESTRICTIONS No  FALLS:  Has patient fallen in last 6 months? No  LIVING ENVIRONMENT: Lives with: lives with their family  OCCUPATION: none  PLOF: Independent  PATIENT GOALS reduce leakage  PERTINENT HISTORY:  none  BOWEL MOVEMENT Pain with  bowel movement: no  Patient reports no stool leakage.  URINATION Pain with urination: No Fully empty bladder: Yes:   Stream: Weak, difficulty starting the stream Urgency: Yes:   Frequency: average Leakage: Coughing and Sneezing Pads: Yes: 2-3 pads per day  INTERCOURSE Pain with intercourse: no   PREGNANCY Vaginal deliveries 4 Tearing Yes: third child C-section deliveries 0 Currently pregnant No    OBJECTIVE:   DIAGNOSTIC FINDINGS:  PVR of 0 ml ; pelvic strength 1/5  PATIENT SURVEYS:  PFIQ-7 48; UIQ-7 48  COGNITION:  Overall cognitive status: Within functional limits for tasks  assessed     SENSATION:  Light touch: Appears intact  Proprioception: Appears intact                POSTURE: No Significant postural limitations   PELVIC ALIGNMENT:  LUMBARAROM/PROM Lumbar ROM is full   LOWER EXTREMITY ROM:  Passive ROM Right eval Left eval  Hip external rotation 40 50   (Blank rows = not tested)  LOWER EXTREMITY MMT:  MMT Right eval Left eval  Hip abduction 4/5 4/5    PALPATION:   General  tenderness located on lower left abdomen; patient will bulge her abdomen when performing an abdominal contraction                External Perineal Exam no tenerness                             Internal Pelvic Floor she will push the therapist finger out when she coughs  Patient confirms identification and approves PT to assess internal pelvic floor and treatment Yes  PELVIC MMT:   MMT eval  Vaginal 2/5  (Blank rows = not tested)        TONE: good  PROLAPSE: none  TODAY'S TREATMENT  EVAL finished eval   PATIENT EDUCATION:  Education details: education on the anatomy of the pelvic floor Person educated: Patient Education method: Explanation and Demonstration Education comprehension: verbalized understanding   HOME EXERCISE PROGRAM: See above.   ASSESSMENT:  CLINICAL IMPRESSION: Patient is a 41 y.o. female  who was seen today for physical therapy  evaluation and treatment for stress incontinence.  She reports intermittent lower abdominal pain at level 8/10 that comes on randomly. She reports urinary leakage with coughing and sneezing. She will wear 2-3 pantyliners daily but when it is allergy time and she has a cold it would be 5. Pelvic floor strength is 2/5. She will push the therapist finger out of the vaginal canal when she coughs. She bulges her abdomen when she contracts the pelvic floor. She has decreased bilateral hip external rotation and bilateral hip abduction strength. Patient would benefit from skilled therapy to improve pelvic floor strength and coordination to reduce her leakage.    OBJECTIVE IMPAIRMENTS decreased activity tolerance, decreased endurance, decreased strength, and pain.   ACTIVITY LIMITATIONS continence  PARTICIPATION LIMITATIONS: community activity  PERSONAL FACTORS Fitness are also affecting patient's functional outcome.   REHAB POTENTIAL: Excellent  CLINICAL DECISION MAKING: Stable/uncomplicated  EVALUATION COMPLEXITY: Low   GOALS: Goals reviewed with patient? Yes  SHORT TERM GOALS: Target date: 09/26/2021  Patient independent with initial HEP  Baseline:not educated yet Goal status: INITIAL   LONG TERM GOALS: Target date: 11/21/2021   Patient is independent with advanced pelvic floor and core strength to reduce her urinary leakage.  Baseline: Not educated yet Goal status: INITIAL  2.  Pelvic floor strength >/= 3/5 to reduce her pad usage to </= 1 pantyliner.  Baseline: uses 2-3 but when sick uses 5, pelvic strength is 2/5 Goal status: INITIAL  3.  Patient will not leak urine when she coughs or sneezes due to her not bulging the pelvic floor instead contracting.  Baseline: bulges the pelvic floor with cough and sneeze Goal status: INITIAL  4.  Patient is able to contract her abdomen correctly without bulging her lower abdomen.  Baseline: bulges her lower abdomen Goal status:  INITIAL   PLAN: PT FREQUENCY: 1x/week  PT DURATION: 12 weeks  PLANNED INTERVENTIONS:  Therapeutic exercises, Therapeutic activity, Neuromuscular re-education, Balance training, Self Care, Joint mobilization, Biofeedback, and Manual therapy  PLAN FOR NEXT SESSION: abdominal contraction, hip strength, hip er stretch   Eulis Foster, PT 08/29/21 11:35 AM

## 2021-08-29 ENCOUNTER — Ambulatory Visit: Payer: Medicaid Other | Attending: Obstetrics and Gynecology | Admitting: Physical Therapy

## 2021-08-29 ENCOUNTER — Encounter: Payer: Self-pay | Admitting: Physical Therapy

## 2021-08-29 DIAGNOSIS — R278 Other lack of coordination: Secondary | ICD-10-CM | POA: Diagnosis not present

## 2021-09-05 ENCOUNTER — Ambulatory Visit: Payer: Medicaid Other | Admitting: Physical Therapy

## 2021-09-05 ENCOUNTER — Encounter: Payer: Self-pay | Admitting: Physical Therapy

## 2021-09-05 DIAGNOSIS — R278 Other lack of coordination: Secondary | ICD-10-CM

## 2021-09-05 NOTE — Therapy (Signed)
OUTPATIENT PHYSICAL THERAPY TREATMENT NOTE   Patient Name: Caitlin [name] MRN: [id number] DOB:Jun 10, 1980, 41 y.o., female Today's Date: 09/05/2021  PCP: none REFERRING PROVIDER: Marguerita Beards, MD  END OF SESSION:   PT End of Session - 09/05/21 0940     Visit Number 2    Date for PT Re-Evaluation 11/21/21    Authorization Type [employer]    Authorization Time Period 08/29/2021 -10/28/2021    Authorization - Visit Number 2    Authorization - Number of Visits 10    PT Start Time 0939   came late   PT Stop Time 1017    PT Time Calculation (min) 38 min    Activity Tolerance Patient tolerated treatment well    Behavior During Therapy [facility] for tasks assessed/performed             Past Medical History:  Diagnosis Date   Anemia    Past Surgical History:  Procedure Laterality Date   NO PAST SURGERIES     Patient Active Problem List   Diagnosis Date Noted   NSVD (normal spontaneous vaginal delivery) 01/23/2015   Pregnancy 01/15/2015   REFERRING DIAG:  N39.3 (ICD-10-CM) - SUI (stress urinary incontinence, female)  R15.9 (ICD-10-CM) - Incontinence of feces, unspecified fecal incontinence type      THERAPY DIAG:  Other lack of coordination   Rationale for Evaluation and Treatment Rehabilitation   ONSET DATE: 03/19/2020   SUBJECTIVE:                                                                                                                                                                                            SUBJECTIVE STATEMENT: Symptoms became worse last year.  Fluid intake: Yes: water, juice       PAIN:  Are you having pain? Yes NPRS scale: 6/10 Pain location:  lower abdominal   Pain type: cramps Pain description: intermittent    Aggravating factors: comes randomly Relieving factors: nothing   PRECAUTIONS: None   WEIGHT BEARING RESTRICTIONS No   FALLS:  Has patient fallen in last 6 months? No   LIVING ENVIRONMENT: Lives with: lives with  their family   OCCUPATION: none   PLOF: Independent   PATIENT GOALS reduce leakage   PERTINENT HISTORY:  none   BOWEL MOVEMENT Pain with bowel movement: no             Patient reports no stool leakage.  URINATION Pain with urination: No Fully empty bladder: Yes:   Stream: Weak, difficulty starting the stream Urgency: Yes:   Frequency: average Leakage: Coughing and Sneezing Pads: Yes: 2-3 pads per day  INTERCOURSE Pain with intercourse: no     PREGNANCY Vaginal deliveries 4 Tearing Yes: third child C-section deliveries 0 Currently pregnant No       OBJECTIVE:    DIAGNOSTIC FINDINGS:  PVR of 0 ml ; pelvic strength 1/5   PATIENT SURVEYS:  PFIQ-7 48; UIQ-7 48   COGNITION:            Overall cognitive status: Within functional limits for tasks assessed                          SENSATION:            Light touch: Appears intact            Proprioception: Appears intact                  POSTURE: No Significant postural limitations               PELVIC ALIGNMENT:   LUMBARAROM/PROM Lumbar ROM is full     LOWER EXTREMITY ROM:   Passive ROM Right eval Left eval  Hip external rotation 40 50   (Blank rows = not tested)   LOWER EXTREMITY MMT:   MMT Right eval Left eval  Hip abduction 4/5 4/5     PALPATION:   General  tenderness located on lower left abdomen; patient will bulge her abdomen when performing an abdominal contraction                 External Perineal Exam no tenerness                             Internal Pelvic Floor she will push the therapist finger out when she coughs   Patient confirms identification and approves PT to assess internal pelvic floor and treatment Yes   PELVIC MMT:   MMT eval  Vaginal 2/5  (Blank rows = not tested)         TONE: good   PROLAPSE: none   TODAY'S TREATMENT  09/05/2021 Neuromuscular re-education: Core retraining: Core facilitation:hookly transverse abdominus with ball squeeze holding 5 sec  and tactile cues to contract lower abdominals Bridge with ball squeeze and pelvic floor squeeze holding 5 sec 15 times Sidley hip abduction 15x each side with tactile cue to keep hip from flexion  Exercises: Stretches/mobility:piriformis stretch in sitting holding for 30 sec bil.  Hamstring stretch hold 30 sec bil.  Standing hip adductor stretch holding 30 sec bil.  Trunk rotation with leg pulled over the other let holding 30 sec bil.  Cat camel 10 times with tactile cues to extend lumbar spine Reverse clam 10x bil.     PATIENT EDUCATION: Education details: Access Code: [email or web address] Person educated: Patient Education method: Explanation, Demonstration, Tactile cues, Verbal cues, and Handouts Education comprehension: verbalized understanding, returned demonstration, verbal cues required, tactile cues required, and needs further education       HOME EXERCISE PROGRAM: 09/05/2021 Access Code: [email or web address] URL: [email or web address] Date: 09/05/2021 Prepared by: Eulis Foster  Exercises - Seated Piriformis Stretch with Trunk Bend  - 1 x daily - 7 x weekly - 1 sets - 2 reps - 30 sec hold - Seated Hamstring Stretch  - 1 x daily - 7 x weekly - 1 sets - 2 reps - 30 sec hold - Side Lunge Adductor Stretch  - 1 x daily - 7 x weekly - 1 sets - 2 reps -  30 sec hold - Supine Piriformis Stretch with Leg Straight  - 1 x daily - 7 x weekly - 1 sets - 2 reps - 30 sec hold - Cat Cow  - 1 x daily - 7 x weekly - 1 sets - 10 reps - Supine Bridge with Mini Swiss Ball Between Knees  - 1 x daily - 3 x weekly - 1 sets - 10 reps - Hooklying Transversus Abdominis Palpation  - 1 x daily - 7 x weekly - 1 sets - 10 reps - Sidelying Hip Abduction at Wall  - 1 x daily - 3 x weekly - 2 sets - 10 reps    ASSESSMENT:   CLINICAL IMPRESSION: Patient is a 40 y.o. female  who was seen today for physical therapy  treatment for stress incontinence.  She reports intermittent lower abdominal pain at level 8/10 that  comes on randomly. She reports urinary leakage with coughing and sneezing. She will wear 2-3 pantyliners daily but when it is allergy time and she has a cold it would be 5. Pelvic floor strength is 2/5. She will push the therapist finger out of the vaginal canal when she coughs. She bulges her abdomen when she contracts the pelvic floor. She has decreased bilateral hip external rotation and bilateral hip abduction strength. Patient would benefit from skilled therapy to improve pelvic floor strength and coordination to reduce her leakage.      OBJECTIVE IMPAIRMENTS decreased activity tolerance, decreased endurance, decreased strength, and pain.    ACTIVITY LIMITATIONS continence   PARTICIPATION LIMITATIONS: community activity   PERSONAL FACTORS Fitness are also affecting patient's functional outcome.    REHAB POTENTIAL: Excellent   CLINICAL DECISION MAKING: Stable/uncomplicated   EVALUATION COMPLEXITY: Low     GOALS: Goals reviewed with patient? Yes   SHORT TERM GOALS: Target date: 09/26/2021   Patient independent with initial HEP  Baseline:not educated yet Goal status: INITIAL     LONG TERM GOALS: Target date: 11/21/2021    Patient is independent with advanced pelvic floor and core strength to reduce her urinary leakage.  Baseline: Not educated yet Goal status: INITIAL   2.  Pelvic floor strength >/= 3/5 to reduce her pad usage to </= 1 pantyliner.  Baseline: uses 2-3 but when sick uses 5, pelvic strength is 2/5 Goal status: INITIAL   3.  Patient will not leak urine when she coughs or sneezes due to her not bulging the pelvic floor instead contracting.  Baseline: bulges the pelvic floor with cough and sneeze Goal status: INITIAL   4.  Patient is able to contract her abdomen correctly without bulging her lower abdomen.  Baseline: bulges her lower abdomen Goal status: INITIAL     PLAN: PT FREQUENCY: 1x/week   PT DURATION: 12 weeks   PLANNED INTERVENTIONS: Therapeutic  exercises, Therapeutic activity, Neuromuscular re-education, Balance training, Self Care, Joint mobilization, Biofeedback, and Manual therapy   PLAN FOR NEXT SESSION: abdominal contraction with hip motion, standing strength ; hip mobilization to improve ER; work on pelvic floor strength, contraction with sneeze and cough  Eulis Foster, PT 09/05/21 10:23 AM

## 2021-09-09 ENCOUNTER — Encounter: Payer: Medicaid Other | Admitting: Physical Therapy

## 2021-09-19 DIAGNOSIS — Z419 Encounter for procedure for purposes other than remedying health state, unspecified: Secondary | ICD-10-CM | POA: Diagnosis not present

## 2021-09-26 ENCOUNTER — Ambulatory Visit: Payer: Commercial Managed Care - HMO | Attending: Obstetrics and Gynecology | Admitting: Physical Therapy

## 2021-09-26 ENCOUNTER — Encounter: Payer: Self-pay | Admitting: Physical Therapy

## 2021-09-26 DIAGNOSIS — R278 Other lack of coordination: Secondary | ICD-10-CM | POA: Diagnosis not present

## 2021-09-26 NOTE — Therapy (Signed)
OUTPATIENT PHYSICAL THERAPY TREATMENT NOTE   Patient Name: Caitlin [name] MRN: [id number] DOB:11-Oct-1980, 41 y.o., female Today's Date: 09/26/2021  PCP: none REFERRING PROVIDER: Jaquita Folds, MD  END OF SESSION:   PT End of Session - 09/26/21 1023     Visit Number 3    Date for PT Re-Evaluation 11/21/21    Authorization Type [employer]    Authorization Time Period 08/29/2021 -10/28/2021    Authorization - Visit Number 3    Authorization - Number of Visits 10    PT Start Time 1020   came   PT Stop Time 1058    PT Time Calculation (min) 38 min    Activity Tolerance Patient tolerated treatment well    Behavior During Therapy [facility] for tasks assessed/performed             Past Medical History:  Diagnosis Date   Anemia    Past Surgical History:  Procedure Laterality Date   NO PAST SURGERIES     Patient Active Problem List   Diagnosis Date Noted   NSVD (normal spontaneous vaginal delivery) 01/23/2015   Pregnancy 01/15/2015   REFERRING DIAG:  N39.3 (ICD-10-CM) - SUI (stress urinary incontinence, female)  R15.9 (ICD-10-CM) - Incontinence of feces, unspecified fecal incontinence type      THERAPY DIAG:  Other lack of coordination   Rationale for Evaluation and Treatment Rehabilitation   ONSET DATE: 03/19/2020   SUBJECTIVE:                                                                                                                                                                                            SUBJECTIVE STATEMENT: The urinary leakage has improved a little. The urinary leakage is 60% better.  sometimes I wil lleak with running with my dog.    PAIN:  Are you having pain? Yes NPRS scale: 6/10 Pain location:  lower abdominal   Pain type: cramps Pain description: intermittent    Aggravating factors: comes randomly Relieving factors: nothing   PRECAUTIONS: None   WEIGHT BEARING RESTRICTIONS No   FALLS:  Has patient fallen in last 6 months?  No   LIVING ENVIRONMENT: Lives with: lives with their family   OCCUPATION: none   PLOF: Independent   PATIENT GOALS reduce leakage   PERTINENT HISTORY:  none   BOWEL MOVEMENT Pain with bowel movement: no             Patient reports no stool leakage.  URINATION Pain with urination: No Fully empty bladder: Yes:   Stream: Weak, difficulty starting the stream Urgency: Yes:   Frequency: average Leakage: Coughing and Sneezing  Pads: Yes: 2-3 pads per day   INTERCOURSE Pain with intercourse: no     PREGNANCY Vaginal deliveries 4 Tearing Yes: third child C-section deliveries 0 Currently pregnant No       OBJECTIVE:    DIAGNOSTIC FINDINGS:  PVR of 0 ml ; pelvic strength 1/5   PATIENT SURVEYS:  PFIQ-7 48; UIQ-7 48   COGNITION:            Overall cognitive status: Within functional limits for tasks assessed                          SENSATION:            Light touch: Appears intact            Proprioception: Appears intact                  POSTURE: No Significant postural limitations               PELVIC ALIGNMENT:   LUMBARAROM/PROM Lumbar ROM is full     LOWER EXTREMITY ROM:   Passive ROM Right eval Left eval Right  09/26/2021  Hip external rotation 40 50 Started at 40 ended with 65   (Blank rows = not tested)   LOWER EXTREMITY MMT:   MMT Right eval Left eval  Hip abduction 4/5 4/5     PALPATION:   General  tenderness located on lower left abdomen; patient will bulge her abdomen when performing an abdominal contraction                 External Perineal Exam no tenerness                             Internal Pelvic Floor she will push the therapist finger out when she coughs   Patient confirms identification and approves PT to assess internal pelvic floor and treatment Yes   PELVIC MMT:   MMT eval  Vaginal 2/5  (Blank rows = not tested)         TONE: good   PROLAPSE: none   TODAY'S TREATMENT  09/26/2021 Manual: Spinal  mobilization:joint mobilization to bilateral hip grade 3 to improve external rotation Neuromuscular re-education: Core facilitation:bridge with holding ball at shoulder height and hip abduction Supine march Supine march with red band around knees holding ball at shoulder height Supine bridge with hip abduction with red band and holding ball at shoulder height Side step in squat with red band around knees holding ball Pelvic floor contraction training:squat 5 x with keeping distance between rib cage and pubic bone Lifting 10# 10x with correct technique.  Exercises: Stretches/mobility:sitting piriformis stretch sitting holding for 30 sec bil.  Therapeutic activities: Functional strengthening activities:Educated patient on keeping the distance between the rib cage and pubic bone and breath out when standing.      09/05/2021 Neuromuscular re-education: Core retraining: Core facilitation:hookly transverse abdominus with ball squeeze holding 5 sec and tactile cues to contract lower abdominals Bridge with ball squeeze and pelvic floor squeeze holding 5 sec 15 times Sidley hip abduction 15x each side with tactile cue to keep hip from flexion   Exercises: Stretches/mobility:piriformis stretch in sitting holding for 30 sec bil.  Hamstring stretch hold 30 sec bil.  Standing hip adductor stretch holding 30 sec bil.  Trunk rotation with leg pulled over the other let holding 30 sec bil.  Cat camel 10 times  with tactile cues to extend lumbar spine Reverse clam 10x bil.     PATIENT EDUCATION: Education details: Access Code: [email or web address] Person educated: Patient Education method: Explanation, Demonstration, Tactile cues, Verbal cues, and Handouts Education comprehension: verbalized understanding, returned demonstration, verbal cues required, tactile cues required, and needs further education       HOME EXERCISE PROGRAM: 09/26/2021  Access Code: [email or web address] URL: [email or web address] Date:  09/26/2021 Prepared by: Earlie Counts  Exercises - Seated Piriformis Stretch with Trunk Bend  - 1 x daily - 7 x weekly - 1 sets - 2 reps - 30 sec hold - Seated Hamstring Stretch  - 1 x daily - 7 x weekly - 1 sets - 2 reps - 30 sec hold - Side Lunge Adductor Stretch  - 1 x daily - 7 x weekly - 1 sets - 2 reps - 30 sec hold - Supine Piriformis Stretch with Leg Straight  - 1 x daily - 7 x weekly - 1 sets - 2 reps - 30 sec hold - Cat Cow  - 1 x daily - 7 x weekly - 1 sets - 10 reps - Sidelying Hip Abduction at Wall  - 1 x daily - 3 x weekly - 2 sets - 10 reps - Supine March with Resistance Band  - 1 x daily - 3 x weekly - 3 sets - 10 reps - Bridge with Hip Abduction and Resistance - Ground Touches  - 1 x daily - 3 x weekly - 3 sets - 10 reps - Side Stepping with Resistance at Thighs  - 1 x daily - 3 x weekly - 3 sets - 10 reps   ASSESSMENT:   CLINICAL IMPRESSION: Patient is a 41 y.o. female  who was seen today for physical therapy  treatment for stress incontinence.  Patient reports her leakage is 60% better. Her right hip external rotation increased to 60 degrees.  Patient has an advanced HEP. Patient understands how to breath out with exertion and keeping distance between rib cage and pubic bone. Patient would benefit from skilled therapy to improve pelvic floor strength and coordination to reduce her leakage.      OBJECTIVE IMPAIRMENTS decreased activity tolerance, decreased endurance, decreased strength, and pain.    ACTIVITY LIMITATIONS continence   PARTICIPATION LIMITATIONS: community activity   PERSONAL FACTORS Fitness are also affecting patient's functional outcome.    REHAB POTENTIAL: Excellent   CLINICAL DECISION MAKING: Stable/uncomplicated   EVALUATION COMPLEXITY: Low     GOALS: Goals reviewed with patient? Yes   SHORT TERM GOALS: Target date: 09/26/2021   Patient independent with initial HEP  Baseline:not educated yet Goal status: Met 09/26/2021     LONG TERM GOALS:  Target date: 11/21/2021    Patient is independent with advanced pelvic floor and core strength to reduce her urinary leakage.  Baseline: Not educated yet Goal status: INITIAL   2.  Pelvic floor strength >/= 3/5 to reduce her pad usage to </= 1 pantyliner.  Baseline: uses 2-3 but when sick uses 5, pelvic strength is 2/5 Goal status: INITIAL   3.  Patient will not leak urine when she coughs or sneezes due to her not bulging the pelvic floor instead contracting.  Baseline: bulges the pelvic floor with cough and sneeze Goal status: INITIAL   4.  Patient is able to contract her abdomen correctly without bulging her lower abdomen.  Baseline: bulges her lower abdomen Goal status: INITIAL     PLAN: PT FREQUENCY: 1x/week   PT DURATION:  12 weeks   PLANNED INTERVENTIONS: Therapeutic exercises, Therapeutic activity, Neuromuscular re-education, Balance training, Self Care, Joint mobilization, Biofeedback, and Manual therapy   PLAN FOR NEXT SESSION:  standing strength ;  work on pelvic floor strength, contraction with sneeze and cough    Earlie Counts, PT 09/26/21 11:00 AM

## 2021-10-06 ENCOUNTER — Encounter: Payer: Self-pay | Admitting: Physical Therapy

## 2021-10-06 ENCOUNTER — Ambulatory Visit: Payer: Commercial Managed Care - HMO | Admitting: Physical Therapy

## 2021-10-06 DIAGNOSIS — R278 Other lack of coordination: Secondary | ICD-10-CM | POA: Diagnosis not present

## 2021-10-06 NOTE — Therapy (Signed)
OUTPATIENT PHYSICAL THERAPY TREATMENT NOTE   Patient Name: Caitlin [name] MRN: [id number] DOB:[birth year], 41 y.o., female Today's Date: 10/06/2021  PCP: none REFERRING PROVIDER: Jaquita Folds, MD  END OF SESSION:    Past Medical History:  Diagnosis Date   Anemia    Past Surgical History:  Procedure Laterality Date   NO PAST SURGERIES     Patient Active Problem List   Diagnosis Date Noted   NSVD (normal spontaneous vaginal delivery) 01/23/2015   Pregnancy 01/15/2015   REFERRING DIAG:  N39.3 (ICD-10-CM) - SUI (stress urinary incontinence, female)  R15.9 (ICD-10-CM) - Incontinence of feces, unspecified fecal incontinence type      THERAPY DIAG:  Other lack of coordination   Rationale for Evaluation and Treatment Rehabilitation   ONSET DATE: 03/19/2020   SUBJECTIVE:                                                                                                                                                                                            SUBJECTIVE STATEMENT: The leakage is 60% better. No lower abdominal pain.   PAIN:  Are you having pain? noNPRS scale: 0/10 Pain location:  lower abdominal    PRECAUTIONS: None   WEIGHT BEARING RESTRICTIONS No   FALLS:  Has patient fallen in last 6 months? No   LIVING ENVIRONMENT: Lives with: lives with their family   OCCUPATION: none   PLOF: Independent   PATIENT GOALS reduce leakage   PERTINENT HISTORY:  none   BOWEL MOVEMENT Pain with bowel movement: no             Patient reports no stool leakage.  URINATION Pain with urination: No Fully empty bladder: Yes:   Stream: average Urgency: Yes:   Frequency: average Leakage: Coughing and Sneezing in standing position Pads: Yes: 2-3 pads per day   INTERCOURSE Pain with intercourse: no     PREGNANCY Vaginal deliveries 4 Tearing Yes: third child C-section deliveries 0 Currently pregnant No       OBJECTIVE:    DIAGNOSTIC FINDINGS:  PVR of  0 ml ; pelvic strength 1/5   PATIENT SURVEYS:  PFIQ-7 48; UIQ-7 48   COGNITION:            Overall cognitive status: Within functional limits for tasks assessed                          SENSATION:            Light touch: Appears intact            Proprioception: Appears intact  POSTURE: No Significant postural limitations               PELVIC ALIGNMENT:   LUMBARAROM/PROM Lumbar ROM is full     LOWER EXTREMITY ROM:   Passive ROM Right eval Left eval Right  09/26/2021  Hip external rotation 40 50 Started at 40 ended with 65   (Blank rows = not tested)   LOWER EXTREMITY MMT:   MMT Right eval Left eval  Hip abduction 4/5 4/5     PALPATION:   General  tenderness located on lower left abdomen; patient will bulge her abdomen when performing an abdominal contraction                 External Perineal Exam no tenerness                             Internal Pelvic Floor she will push the therapist finger out when she coughs   Patient confirms identification and approves PT to assess internal pelvic floor and treatment Yes   PELVIC MMT:   MMT eval 10/06/2021  Vaginal 2/5 3/5 with the smallest lift in supine holding 5 seconds  2/5 in standing  holding 2 sec  (Blank rows = not tested)         TONE: good   PROLAPSE: none   TODAY'S TREATMENT  10/06/2021 Manual: Internal pelvic floor techniques:No emotional/communication barriers or cognitive limitation. Patient is motivated to learn. Patient understands and agrees with treatment goals and plan. PT explains patient will be examined in standing, sitting, and lying down to see how their muscles and joints work. When they are ready, they will be asked to remove their underwear so PT can examine their perineum. The patient is also given the option of providing their own chaperone as one is not provided in our facility. The patient also has the right and is explained the right to defer or refuse any part of the  evaluation or treatment including the internal exam. With the patient's consent, PT will use one gloved finger to gently assess the muscles of the pelvic floor, seeing how well it contracts and relaxes and if there is muscle symmetry. After, the patient will get dressed and PT and patient will discuss exam findings and plan of care. PT and patient discuss plan of care, schedule, attendance policy and HEP activities.  Going through the vagina working on the sides of the introitus, along the posterior vaginal wall, perineal body, urogenital diaphragm Neuromuscular re-education: Pelvic floor contraction training:supine pelvic floor contraction with working on 5 seconds without fatigue then work on quick flicks.  Pelvic floor contraction in standing with coughing, quick contractions, holding for 5 seconds without fatigue    09/26/2021 Manual: Spinal mobilization:joint mobilization to bilateral hip grade 3 to improve external rotation Neuromuscular re-education: Core facilitation:bridge with holding ball at shoulder height and hip abduction Supine march Supine march with red band around knees holding ball at shoulder height Supine bridge with hip abduction with red band and holding ball at shoulder height Side step in squat with red band around knees holding ball Pelvic floor contraction training:squat 5 x with keeping distance between rib cage and pubic bone Lifting 10# 10x with correct technique.  Exercises: Stretches/mobility:sitting piriformis stretch sitting holding for 30 sec bil.  Therapeutic activities: Functional strengthening activities:Educated patient on keeping the distance between the rib cage and pubic bone and breath out when standing.  09/05/2021 Neuromuscular re-education: Core facilitation:hookly transverse abdominus with ball squeeze holding 5 sec and tactile cues to contract lower abdominals Bridge with ball squeeze and pelvic floor squeeze holding 5 sec 15 times Sidley  hip abduction 15x each side with tactile cue to keep hip from flexion   Exercises: Stretches/mobility:piriformis stretch in sitting holding for 30 sec bil.  Hamstring stretch hold 30 sec bil.  Standing hip adductor stretch holding 30 sec bil.  Trunk rotation with leg pulled over the other let holding 30 sec bil.  Cat camel 10 times with tactile cues to extend lumbar spine Reverse clam 10x bil.     PATIENT EDUCATION: 10/06/2021 Education details: Access Code: [email or web address] Person educated: Patient Education method: Explanation, Demonstration, Tactile cues, Verbal cues, and Handouts Education comprehension: verbalized understanding, returned demonstration, verbal cues required, tactile cues required, and needs further education       HOME EXERCISE PROGRAM: 10/06/2021   Access Code: [email or web address] URL: [email or web address] Date: 10/06/2021 Prepared by: Earlie Counts  Exercises - Seated Piriformis Stretch with Trunk Bend  - 1 x daily - 7 x weekly - 1 sets - 2 reps - 30 sec hold - Seated Hamstring Stretch  - 1 x daily - 7 x weekly - 1 sets - 2 reps - 30 sec hold - Side Lunge Adductor Stretch  - 1 x daily - 7 x weekly - 1 sets - 2 reps - 30 sec hold - Supine Piriformis Stretch with Leg Straight  - 1 x daily - 7 x weekly - 1 sets - 2 reps - 30 sec hold - Cat Cow  - 1 x daily - 7 x weekly - 1 sets - 10 reps - Sidelying Hip Abduction at Wall  - 1 x daily - 3 x weekly - 2 sets - 10 reps - Supine March with Resistance Band  - 1 x daily - 3 x weekly - 3 sets - 10 reps - Bridge with Hip Abduction and Resistance - Ground Touches  - 1 x daily - 3 x weekly - 3 sets - 10 reps - Side Stepping with Resistance at Thighs  - 1 x daily - 3 x weekly - 3 sets - 10 reps - Seated Pelvic Floor Contraction  - 4 x daily - 7 x weekly - 1 sets - 5 reps - 5 sec hold - Seated Quick Flick Pelvic Floor Contractions  - 4 x daily - 7 x weekly - 1 sets - 5 reps ASSESSMENT:   CLINICAL IMPRESSION: Patient is a 41  y.o. female  who was seen today for physical therapy  treatment for stress incontinence.  Patient does not have abdominal pain now. Patient is able to start urine stream. Patient will leak in stading with coughing and sneezing. Pelvic floor strength in standing is 2/5 holding for 2 seconds and supine is 3/5 holding for 5 seconds with fatigue. She has tightness along the introitus and poster fourchette.   Patient does not bulge the pelvic floor with coughing and sneezing. Patient would benefit from skilled therapy to improve pelvic floor strength and coordination to reduce her leakage.      OBJECTIVE IMPAIRMENTS decreased activity tolerance, decreased endurance, decreased strength, and pain.    ACTIVITY LIMITATIONS continence   PARTICIPATION LIMITATIONS: community activity   PERSONAL FACTORS Fitness are also affecting patient's functional outcome.    REHAB POTENTIAL: Excellent   CLINICAL DECISION MAKING: Stable/uncomplicated   EVALUATION COMPLEXITY: Low     GOALS: Goals reviewed with patient? Yes  SHORT TERM GOALS: Target date: 09/26/2021   Patient independent with initial HEP  Baseline:not educated yet Goal status: Met 09/26/2021     LONG TERM GOALS: Target date: 11/21/2021    Patient is independent with advanced pelvic floor and core strength to reduce her urinary leakage.  Baseline: Not educated yet Goal status: INITIAL   2.  Pelvic floor strength >/= 3/5 to reduce her pad usage to </= 1 pantyliner.  Baseline: uses 2-3 but when sick uses 5, pelvic strength is 2/5 Goal status: INITIAL   3.  Patient will not leak urine when she coughs or sneezes due to her not bulging the pelvic floor instead contracting.  Baseline: leaks in standing with coughing and sneezing Goal status: INITIAL   4.  Patient is able to contract her abdomen correctly without bulging her lower abdomen.  Baseline: bulges her lower abdomen Goal status: INITIAL     PLAN: PT FREQUENCY: 1x/week   PT  DURATION: 12 weeks   PLANNED INTERVENTIONS: Therapeutic exercises, Therapeutic activity, Neuromuscular re-education, Balance training, Self Care, Joint mobilization, Biofeedback, and Manual therapy   PLAN FOR NEXT SESSION:  standing strength ;  work on pelvic floor strength, contraction with sneeze and cough, put in to continue for insurance   Earlie Counts, PT 10/06/21 11:04 AM

## 2021-10-13 ENCOUNTER — Ambulatory Visit: Payer: Commercial Managed Care - HMO | Admitting: Physical Therapy

## 2021-10-13 ENCOUNTER — Encounter: Payer: Self-pay | Admitting: Physical Therapy

## 2021-10-13 DIAGNOSIS — R278 Other lack of coordination: Secondary | ICD-10-CM

## 2021-10-13 NOTE — Therapy (Signed)
OUTPATIENT PHYSICAL THERAPY TREATMENT NOTE   Patient Name: Caitlin [name] MRN: [id number] DOB:[birth year], 41 y.o., female Today's Date: 10/13/2021  PCP: none REFERRING PROVIDER: Jaquita Folds, MD  END OF SESSION:   PT End of Session - 10/13/21 0855     Visit Number 5    Date for PT Re-Evaluation 11/21/21    Authorization Type [employer]    Authorization Time Period 08/29/2021 -10/28/2021    Authorization - Visit Number 5    Authorization - Number of Visits 10    PT Start Time 0845    PT Stop Time 0925    PT Time Calculation (min) 40 min    Activity Tolerance Patient tolerated treatment well    Behavior During Therapy [facility] for tasks assessed/performed             Past Medical History:  Diagnosis Date   Anemia    Past Surgical History:  Procedure Laterality Date   NO PAST SURGERIES     Patient Active Problem List   Diagnosis Date Noted   NSVD (normal spontaneous vaginal delivery) 01/23/2015   Pregnancy 01/15/2015   REFERRING DIAG:  N39.3 (ICD-10-CM) - SUI (stress urinary incontinence, female)  R15.9 (ICD-10-CM) - Incontinence of feces, unspecified fecal incontinence type      THERAPY DIAG:  Other lack of coordination   Rationale for Evaluation and Treatment Rehabilitation   ONSET DATE: 03/19/2020   SUBJECTIVE:                                                                                                                                                                                            SUBJECTIVE STATEMENT: I have not had any leakage since last visit but have not been coughing. I had some abdominal cramping last night.   PAIN:  Are you having pain? noNPRS scale: 0/10 Pain location:  lower abdominal    PRECAUTIONS: None   WEIGHT BEARING RESTRICTIONS No   FALLS:  Has patient fallen in last 6 months? No   LIVING ENVIRONMENT: Lives with: lives with their family   OCCUPATION: none   PLOF: Independent   PATIENT GOALS reduce leakage    PERTINENT HISTORY:  none   BOWEL MOVEMENT Pain with bowel movement: no             Patient reports no stool leakage.  URINATION Pain with urination: No Fully empty bladder: Yes:   Stream: average Urgency: Yes:   Frequency: average Leakage: Coughing and Sneezing in standing position Pads: Yes: 2-3 pads per day   INTERCOURSE Pain with intercourse: no     PREGNANCY Vaginal deliveries 4 Tearing Yes: third  child C-section deliveries 0 Currently pregnant No       OBJECTIVE:    DIAGNOSTIC FINDINGS:  PVR of 0 ml ; pelvic strength 1/5   PATIENT SURVEYS:  PFIQ-7 48; UIQ-7 48   COGNITION:            Overall cognitive status: Within functional limits for tasks assessed                          SENSATION:            Light touch: Appears intact            Proprioception: Appears intact                  POSTURE: No Significant postural limitations               PELVIC ALIGNMENT:   LUMBARAROM/PROM Lumbar ROM is full     LOWER EXTREMITY ROM:   Passive ROM Right eval Left eval Right  09/26/2021  Hip external rotation 40 50 Started at 40 ended with 65   (Blank rows = not tested)   LOWER EXTREMITY MMT:   MMT Right eval Left eval Right 10/13/2021 Left 10/13/2021  Hip abduction 4/5 4/5 4/5 4/5     PALPATION:   General  tenderness located on lower left abdomen; patient will bulge her abdomen when performing an abdominal contraction                 External Perineal Exam no tenerness                             Internal Pelvic Floor she will push the therapist finger out when she coughs   Patient confirms identification and approves PT to assess internal pelvic floor and treatment Yes   PELVIC MMT:   MMT eval 10/06/2021  Vaginal 2/5 3/5 with the smallest lift in supine holding 5 seconds  2/5 in standing  holding 2 sec  (Blank rows = not tested)         TONE: good   PROLAPSE: none   TODAY'S TREATMENT  10/13/2021 Manual: Soft tissue mobilization:manual  work to the lower abdominal region to release the restrictions  Myofascial release:release of the suprapubic area, release of the mesenteric root and lower lateral abdominals Exercises: Stretches/mobility; half kneel hip flexor stretch holding for 30 sec bil.  Half kneel with one leg on ground in ER and other forward hold 30 sec bil.  Stand with feet apart and hands on floor Stand with feet apart and one hand on ground and rotate trunk Prone press up hold 5 sec 5 times     10/06/2021 Manual: Internal pelvic floor techniques:No emotional/communication barriers or cognitive limitation. Patient is motivated to learn. Patient understands and agrees with treatment goals and plan. PT explains patient will be examined in standing, sitting, and lying down to see how their muscles and joints work. When they are ready, they will be asked to remove their underwear so PT can examine their perineum. The patient is also given the option of providing their own chaperone as one is not provided in our facility. The patient also has the right and is explained the right to defer or refuse any part of the evaluation or treatment including the internal exam. With the patient's consent, PT will use one gloved finger to gently assess the muscles of the pelvic floor,  seeing how well it contracts and relaxes and if there is muscle symmetry. After, the patient will get dressed and PT and patient will discuss exam findings and plan of care. PT and patient discuss plan of care, schedule, attendance policy and HEP activities.  Going through the vagina working on the sides of the introitus, along the posterior vaginal wall, perineal body, urogenital diaphragm Neuromuscular re-education: Pelvic floor contraction training:supine pelvic floor contraction with working on 5 seconds without fatigue then work on quick flicks.  Pelvic floor contraction in standing with coughing, quick contractions, holding for 5 seconds without fatigue     09/26/2021 Manual: Spinal mobilization:joint mobilization to bilateral hip grade 3 to improve external rotation Neuromuscular re-education: Core facilitation:bridge with holding ball at shoulder height and hip abduction Supine march Supine march with red band around knees holding ball at shoulder height Supine bridge with hip abduction with red band and holding ball at shoulder height Side step in squat with red band around knees holding ball Pelvic floor contraction training:squat 5 x with keeping distance between rib cage and pubic bone Lifting 10# 10x with correct technique.  Exercises: Stretches/mobility:sitting piriformis stretch sitting holding for 30 sec bil.  Therapeutic activities: Functional strengthening activities:Educated patient on keeping the distance between the rib cage and pubic bone and breath out when standing.          PATIENT EDUCATION: 10/06/2021 Education details: Access Code: [email or web address] Person educated: Patient Education method: Explanation, Demonstration, Tactile cues, Verbal cues, and Handouts Education comprehension: verbalized understanding, returned demonstration, verbal cues required, tactile cues required, and needs further education       HOME EXERCISE PROGRAM: 10/06/2021   Access Code: [email or web address] URL: [email or web address] Date: 10/06/2021 Prepared by: Earlie Counts   Exercises - Seated Piriformis Stretch with Trunk Bend  - 1 x daily - 7 x weekly - 1 sets - 2 reps - 30 sec hold - Seated Hamstring Stretch  - 1 x daily - 7 x weekly - 1 sets - 2 reps - 30 sec hold - Side Lunge Adductor Stretch  - 1 x daily - 7 x weekly - 1 sets - 2 reps - 30 sec hold - Supine Piriformis Stretch with Leg Straight  - 1 x daily - 7 x weekly - 1 sets - 2 reps - 30 sec hold - Cat Cow  - 1 x daily - 7 x weekly - 1 sets - 10 reps - Sidelying Hip Abduction at Wall  - 1 x daily - 3 x weekly - 2 sets - 10 reps - Supine March with Resistance Band  - 1 x daily - 3 x  weekly - 3 sets - 10 reps - Bridge with Hip Abduction and Resistance - Ground Touches  - 1 x daily - 3 x weekly - 3 sets - 10 reps - Side Stepping with Resistance at Thighs  - 1 x daily - 3 x weekly - 3 sets - 10 reps - Seated Pelvic Floor Contraction  - 4 x daily - 7 x weekly - 1 sets - 5 reps - 5 sec hold - Seated Quick Flick Pelvic Floor Contractions  - 4 x daily - 7 x weekly - 1 sets - 5 reps ASSESSMENT:   CLINICAL IMPRESSION: Patient is a 41 y.o. female  who was seen today for physical therapy  treatment for stress incontinence.  Patient has not leaked urine but has not been challenged with standing and coughing.    Patient had some abdominal discomfort and  did well with the manual work. Patient does not bulge the pelvic floor with coughing and sneezing. Patient would benefit from skilled therapy to improve pelvic floor strength and coordination to reduce her leakage.      OBJECTIVE IMPAIRMENTS decreased activity tolerance, decreased endurance, decreased strength, and pain.    ACTIVITY LIMITATIONS continence   PARTICIPATION LIMITATIONS: community activity   PERSONAL FACTORS Fitness are also affecting patient's functional outcome.    REHAB POTENTIAL: Excellent   CLINICAL DECISION MAKING: Stable/uncomplicated   EVALUATION COMPLEXITY: Low     GOALS: Goals reviewed with patient? Yes   SHORT TERM GOALS: Target date: 09/26/2021   Patient independent with initial HEP  Baseline:not educated yet Goal status: Met 09/26/2021     LONG TERM GOALS: Target date: 11/21/2021    Patient is independent with advanced pelvic floor and core strength to reduce her urinary leakage.  Baseline: Not educated yet Goal status: ongoing 10/13/2021   2.  Pelvic floor strength >/= 3/5 to reduce her pad usage to </= 1 pantyliner.  Baseline: uses 2-3 but when sick uses 5, pelvic strength is 2/5 Goal status: ongoing 10/13/2021   3.  Patient will not leak urine when she coughs or sneezes due to her not  bulging the pelvic floor instead contracting.  Baseline: leaks in standing with coughing and sneezing Goal status: ongoing 10/13/2021   4.  Patient is able to contract her abdomen correctly without bulging her lower abdomen.  Baseline: bulges her lower abdomen Goal status: ongoing 10/13/2021     PLAN: PT FREQUENCY: 1x/week   PT DURATION: 12 weeks   PLANNED INTERVENTIONS: Therapeutic exercises, Therapeutic activity, Neuromuscular re-education, Balance training, Self Care, Joint mobilization, Biofeedback, and Manual therapy   PLAN FOR NEXT SESSION:  standing strength ;  work on pelvic floor strength, contraction with sneeze and cough, put in to continue for insurance    Earlie Counts, PT 10/13/21 9:26 AM

## 2021-10-19 DIAGNOSIS — Z419 Encounter for procedure for purposes other than remedying health state, unspecified: Secondary | ICD-10-CM | POA: Diagnosis not present

## 2021-10-22 ENCOUNTER — Ambulatory Visit: Payer: Commercial Managed Care - HMO | Attending: Obstetrics and Gynecology | Admitting: Physical Therapy

## 2021-10-22 ENCOUNTER — Encounter: Payer: Self-pay | Admitting: Physical Therapy

## 2021-10-22 DIAGNOSIS — R278 Other lack of coordination: Secondary | ICD-10-CM | POA: Insufficient documentation

## 2021-10-22 NOTE — Therapy (Addendum)
OUTPATIENT PHYSICAL THERAPY TREATMENT NOTE   Patient Name: Caitlin [name] MRN: [id number] DOB:[birth year], 41 y.o., female Today's Date: 10/22/2021  PCP: none REFERRING PROVIDER: Jaquita Folds, MD  END OF SESSION:   PT End of Session - 10/22/21 1106     Visit Number 6    Date for PT Re-Evaluation 11/21/21    Authorization Type [employer]    Authorization Time Period 08/29/2021 -10/28/2021    Authorization - Visit Number 6    Authorization - Number of Visits 10    PT Start Time 1100    PT Stop Time 1140    PT Time Calculation (min) 40 min    Activity Tolerance Patient tolerated treatment well    Behavior During Therapy WFL for tasks assessed/performed             Past Medical History:  Diagnosis Date   Anemia    Past Surgical History:  Procedure Laterality Date   NO PAST SURGERIES     Patient Active Problem List   Diagnosis Date Noted   NSVD (normal spontaneous vaginal delivery) 01/23/2015   Pregnancy 01/15/2015   REFERRING DIAG:  N39.3 (ICD-10-CM) - SUI (stress urinary incontinence, female)  R15.9 (ICD-10-CM) - Incontinence of feces, unspecified fecal incontinence type      THERAPY DIAG:  Other lack of coordination   Rationale for Evaluation and Treatment Rehabilitation   ONSET DATE: 03/19/2020   SUBJECTIVE:                                                                                                                                                                                            SUBJECTIVE STATEMENT: I have not had any leakage since last visit but have not been coughing. I had some abdominal cramping last night.    PAIN:  Are you having pain? noNPRS scale: 0/10 Pain location:  lower abdominal    PRECAUTIONS: None   WEIGHT BEARING RESTRICTIONS No   FALLS:  Has patient fallen in last 6 months? No   LIVING ENVIRONMENT: Lives with: lives with their family   OCCUPATION: none   PLOF: Independent   PATIENT GOALS reduce leakage    PERTINENT HISTORY:  none   BOWEL MOVEMENT Pain with bowel movement: no             Patient reports no stool leakage.  URINATION Pain with urination: No Fully empty bladder: Yes:   Stream: average Urgency: Yes:   Frequency: average Leakage: Coughing and Sneezing in standing position Pads: Yes: 2-3 pads per day   INTERCOURSE Pain with intercourse: no     PREGNANCY Vaginal deliveries 4 Tearing Yes:  third child C-section deliveries 0 Currently pregnant No       OBJECTIVE:    DIAGNOSTIC FINDINGS:  PVR of 0 ml ; pelvic strength 1/5   PATIENT SURVEYS:  PFIQ-7 48; UIQ-7 48; 10/22/2021 PFIQ-7 61; UIQ-7 85  COGNITION:            Overall cognitive status: Within functional limits for tasks assessed                          SENSATION:            Light touch: Appears intact            Proprioception: Appears intact                  POSTURE: No Significant postural limitations               PELVIC ALIGNMENT:   LUMBARAROM/PROM Lumbar ROM is full     LOWER EXTREMITY ROM:   Passive ROM Right eval Left eval Right  09/26/2021 Right  10/22/2021  Hip external rotation 40 50 Started at 40 ended with 65 55   (Blank rows = not tested)   LOWER EXTREMITY MMT:   MMT Right eval Left eval Right 10/13/2021 Left 10/13/2021 Right  10/22/2021 Left  10/22/2021  Hip abduction 4/5 4/5 4/5 4/5 4/5 4/5     PALPATION:   General  tenderness located on lower left abdomen; patient will bulge her abdomen when performing an abdominal contraction                 External Perineal Exam no tenerness                             Internal Pelvic Floor she will push the therapist finger out when she coughs   Patient confirms identification and approves PT to assess internal pelvic floor and treatment Yes   PELVIC MMT:   MMT eval 10/06/2021 10/22/2021  Vaginal 2/5 3/5 with the smallest lift in supine holding 5 seconds  2/5 in standing  holding 2 sec 3/5 with hug of therapist finger.   (Blank  rows = not tested)         TONE: good   PROLAPSE: none   TODAY'S TREATMENT  10/22/2021 Manual: Internal pelvic floor techniques:No emotional/communication barriers or cognitive limitation. Patient is motivated to learn. Patient understands and agrees with treatment goals and plan. PT explains patient will be examined in standing, sitting, and lying down to see how their muscles and joints work. When they are ready, they will be asked to remove their underwear so PT can examine their perineum. The patient is also given the option of providing their own chaperone as one is not provided in our facility. The patient also has the right and is explained the right to defer or refuse any part of the evaluation or treatment including the internal exam. With the patient's consent, PT will use one gloved finger to gently assess the muscles of the pelvic floor, seeing how well it contracts and relaxes and if there is muscle symmetry. After, the patient will get dressed and PT and patient will discuss exam findings and plan of care. PT and patient discuss plan of care, schedule, attendance policy and HEP activities.  Therapist finger in the vaginal canal working on the introitus, obturator internist and levator ani to get a good circular contraction Neuromuscular re-education:  Pelvic floor contraction training:supine with therapist finger in the vaginal canal feeling for the pelvic floor and lower abdominals  Sitting pelvic floor contraction with feeling the lift.  Therapist finger in the vaginal canal giving tactile cues to lift the pelvic floor up and hug the therapist finger.  Coughing with pelvic floor contraction and therapist giving tactile cues.  10/13/2021 Manual: Soft tissue mobilization:manual work to the lower abdominal region to release the restrictions  Myofascial release:release of the suprapubic area, release of the mesenteric root and lower lateral abdominals Exercises: Stretches/mobility;  half kneel hip flexor stretch holding for 30 sec bil.  Half kneel with one leg on ground in ER and other forward hold 30 sec bil.  Stand with feet apart and hands on floor Stand with feet apart and one hand on ground and rotate trunk Prone press up hold 5 sec 5 times     10/06/2021 Manual: Internal pelvic floor techniques:No emotional/communication barriers or cognitive limitation. Patient is motivated to learn. Patient understands and agrees with treatment goals and plan. PT explains patient will be examined in standing, sitting, and lying down to see how their muscles and joints work. When they are ready, they will be asked to remove their underwear so PT can examine their perineum. The patient is also given the option of providing their own chaperone as one is not provided in our facility. The patient also has the right and is explained the right to defer or refuse any part of the evaluation or treatment including the internal exam. With the patient's consent, PT will use one gloved finger to gently assess the muscles of the pelvic floor, seeing how well it contracts and relaxes and if there is muscle symmetry. After, the patient will get dressed and PT and patient will discuss exam findings and plan of care. PT and patient discuss plan of care, schedule, attendance policy and HEP activities.  Going through the vagina working on the sides of the introitus, along the posterior vaginal wall, perineal body, urogenital diaphragm Neuromuscular re-education: Pelvic floor contraction training:supine pelvic floor contraction with working on 5 seconds without fatigue then work on quick flicks.  Pelvic floor contraction in standing with coughing, quick contractions, holding for 5 seconds without fatigue    09/26/2021 Manual: Spinal mobilization:joint mobilization to bilateral hip grade 3 to improve external rotation Neuromuscular re-education: Core facilitation:bridge with holding ball at shoulder height and  hip abduction Supine march Supine march with red band around knees holding ball at shoulder height Supine bridge with hip abduction with red band and holding ball at shoulder height Side step in squat with red band around knees holding ball Pelvic floor contraction training:squat 5 x with keeping distance between rib cage and pubic bone Lifting 10# 10x with correct technique.  Exercises: Stretches/mobility:sitting piriformis stretch sitting holding for 30 sec bil.  Therapeutic activities: Functional strengthening activities:Educated patient on keeping the distance between the rib cage and pubic bone and breath out when standing.          PATIENT EDUCATION: 10/06/2021 Education details: Access Code: [email or web address] Person educated: Patient Education method: Explanation, Demonstration, Tactile cues, Verbal cues, and Handouts Education comprehension: verbalized understanding, returned demonstration, verbal cues required, tactile cues required, and needs further education       HOME EXERCISE PROGRAM: 10/06/2021   Access Code: [email or web address] URL: [email or web address] Date: 10/06/2021 Prepared by: Earlie Counts   Exercises - Seated Piriformis Stretch with Trunk Bend  - 1 x daily - 7 x weekly - 1  sets - 2 reps - 30 sec hold - Seated Hamstring Stretch  - 1 x daily - 7 x weekly - 1 sets - 2 reps - 30 sec hold - Side Lunge Adductor Stretch  - 1 x daily - 7 x weekly - 1 sets - 2 reps - 30 sec hold - Supine Piriformis Stretch with Leg Straight  - 1 x daily - 7 x weekly - 1 sets - 2 reps - 30 sec hold - Cat Cow  - 1 x daily - 7 x weekly - 1 sets - 10 reps - Sidelying Hip Abduction at Wall  - 1 x daily - 3 x weekly - 2 sets - 10 reps - Supine March with Resistance Band  - 1 x daily - 3 x weekly - 3 sets - 10 reps - Bridge with Hip Abduction and Resistance - Ground Touches  - 1 x daily - 3 x weekly - 3 sets - 10 reps - Side Stepping with Resistance at Thighs  - 1 x daily - 3 x weekly - 3 sets -  10 reps - Seated Pelvic Floor Contraction  - 4 x daily - 7 x weekly - 1 sets - 5 reps - 5 sec hold - Seated Quick Flick Pelvic Floor Contractions  - 4 x daily - 7 x weekly - 1 sets - 5 reps ASSESSMENT:   CLINICAL IMPRESSION: Patient is a 41 y.o. female  who was seen today for physical therapy  treatment for stress incontinence. Patient is wearing 1 pad daily but when she is sick it is 5 pads. Pelvic floor strength is 3/5 with hug of therapist finger. She is not able to hold the pelvic floor contraction when she coughs in supine position. Patient continues to leak with coughing and sneezing.  Patient right hip ER increased from 40 degrees to 55 degrees. Patient is feeling very frustrated with her urinary leakage. She has trouble being out and coughing an sneezing when she leaks urine. She has trouble with exercise due to her leakage and the pelvic floor contraction when she has pressure from above. Patient would benefit from skilled therapy to improve pelvic floor strength and coordination to reduce her leakage.      OBJECTIVE IMPAIRMENTS decreased activity tolerance, decreased endurance, decreased strength, and pain.    ACTIVITY LIMITATIONS continence   PARTICIPATION LIMITATIONS: community activity   PERSONAL FACTORS Fitness are also affecting patient's functional outcome.    REHAB POTENTIAL: Excellent   CLINICAL DECISION MAKING: Stable/uncomplicated   EVALUATION COMPLEXITY: Low     GOALS: Goals reviewed with patient? Yes   SHORT TERM GOALS: Target date: 09/26/2021   Patient independent with initial HEP  Baseline:not educated yet Goal status: Met 09/26/2021     LONG TERM GOALS: Target date: 11/21/2021    Patient is independent with advanced pelvic floor and core strength to reduce her urinary leakage.  Baseline: Not educated yet Goal status: ongoing 10/22/2021  2.  Pelvic floor strength >/= 3/5 to reduce her pad usage to </= 1 pantyliner.  Baseline: uses 1 daily but when sick uses  5, pelvic strength is 2/5 Goal status: ongoing 10/13/2021   3.  Patient will not leak urine when she coughs or sneezes due to her not bulging the pelvic floor instead contracting.  Baseline: leaks in standing with coughing and sneezing Goal status: ongoing 10/22/2021  4.  Patient is able to contract her abdomen correctly without bulging her lower abdomen.  Baseline: bulges her lower abdomen  Goal status: Met 10/22/2021     PLAN: PT FREQUENCY: 1x/week   PT DURATION: 12 weeks   PLANNED INTERVENTIONS: Therapeutic exercises, Therapeutic activity, Neuromuscular re-education, Balance training, Self Care, Joint mobilization, Biofeedback, and Manual therapy   PLAN FOR NEXT SESSION:  standing strength ;  work on pelvic floor strength, contraction with sneeze and cough  Earlie Counts, PT 10/22/21 11:08 AM   PHYSICAL THERAPY DISCHARGE SUMMARY  Visits from Start of Care: 6  Current functional level related to goals / functional outcomes: See above. Patient canceled her appointments due to having a procedure.    Remaining deficits: See above.    Education / Equipment: HEP   Patient agrees to discharge. Patient goals were partially met. Patient is being discharged due to a change in medical status. Thank you for the referral. Earlie Counts, PT 12/01/21 8:36 AM

## 2021-11-07 ENCOUNTER — Encounter: Payer: Self-pay | Admitting: Obstetrics and Gynecology

## 2021-11-07 ENCOUNTER — Ambulatory Visit (INDEPENDENT_AMBULATORY_CARE_PROVIDER_SITE_OTHER): Payer: Commercial Managed Care - HMO | Admitting: Obstetrics and Gynecology

## 2021-11-07 VITALS — BP 99/64 | HR 76

## 2021-11-07 DIAGNOSIS — N393 Stress incontinence (female) (male): Secondary | ICD-10-CM

## 2021-11-07 NOTE — Progress Notes (Signed)
[facility] Return Visit  SUBJECTIVE  History of Present Illness: Caitlin [name] is a 41 y.o. female seen in follow-up for SUI and bowel leakage. Plan at last visit was to start pelvic PT.   Feels that PT helped some, still doing the exercises at home. Still has leakage with strong cough.   Not having any bowel leakage currently. Made some dietary changes.   Past Medical History: Patient  has a past medical history of Anemia.   Past Surgical History: She  has a past surgical history that includes No past surgeries.   Medications: She has a current medication list which includes the following prescription(s): ibuprofen and levonorgestrel.   Allergies: Patient has No Known Allergies.   Social History: Patient  reports that she has never smoked. She has never used smokeless tobacco. She reports that she does not drink alcohol and does not use drugs.      OBJECTIVE     Physical Exam: Vitals:   11/07/21 0825  BP: 99/64  Pulse: 76   Gen: No apparent distress, A&O x 3.  Detailed Urogynecologic Evaluation:  Deferred.    ASSESSMENT AND PLAN    Caitlin [name] is a 41 y.o. with:  1. SUI (stress urinary incontinence, female)    - For SUI, reviewed options of pessary, urethral bulking and sling. She is interested in urethral bulking in the office. Discussed details of procedure and provided handout.  - Will plan for simple CMG for her to demonstrate leakage prior to the procedure.  - Once procedure is scheduled, will send her antibiotics and valium for the procedure. We discussed she will need a driver since she is planning to take valium.   Jaquita Folds, MD  Time spent: I spent 22 minutes dedicated to the care of this patient on the date of this encounter to include pre-visit review of records, face-to-face time with the patient discussing options and post visit documentation and ordering medication/ testing.

## 2021-11-13 ENCOUNTER — Ambulatory Visit (INDEPENDENT_AMBULATORY_CARE_PROVIDER_SITE_OTHER): Payer: Commercial Managed Care - HMO | Admitting: Student

## 2021-11-13 ENCOUNTER — Encounter: Payer: Self-pay | Admitting: Student

## 2021-11-13 VITALS — BP 106/59 | HR 87 | Temp 98.4°F | Ht 63.0 in | Wt 188.1 lb

## 2021-11-13 DIAGNOSIS — R748 Abnormal levels of other serum enzymes: Secondary | ICD-10-CM | POA: Insufficient documentation

## 2021-11-13 DIAGNOSIS — R7303 Prediabetes: Secondary | ICD-10-CM

## 2021-11-13 DIAGNOSIS — B9689 Other specified bacterial agents as the cause of diseases classified elsewhere: Secondary | ICD-10-CM

## 2021-11-13 DIAGNOSIS — N393 Stress incontinence (female) (male): Secondary | ICD-10-CM | POA: Diagnosis not present

## 2021-11-13 DIAGNOSIS — E669 Obesity, unspecified: Secondary | ICD-10-CM

## 2021-11-13 DIAGNOSIS — O99019 Anemia complicating pregnancy, unspecified trimester: Secondary | ICD-10-CM

## 2021-11-13 DIAGNOSIS — E7800 Pure hypercholesterolemia, unspecified: Secondary | ICD-10-CM

## 2021-11-13 DIAGNOSIS — N760 Acute vaginitis: Secondary | ICD-10-CM | POA: Diagnosis not present

## 2021-11-13 DIAGNOSIS — R238 Other skin changes: Secondary | ICD-10-CM

## 2021-11-13 DIAGNOSIS — E66811 Obesity, class 1: Secondary | ICD-10-CM

## 2021-11-13 HISTORY — DX: Anemia complicating pregnancy, unspecified trimester: O99.019

## 2021-11-13 HISTORY — DX: Abnormal levels of other serum enzymes: R74.8

## 2021-11-13 HISTORY — DX: Other specified bacterial agents as the cause of diseases classified elsewhere: B96.89

## 2021-11-13 NOTE — Assessment & Plan Note (Signed)
Patient following urogynecology for stress incontinence. She initially was trialed on pelvic PT, which helped to a degree but did not resolve her symptoms. She continued to experience urinary leakage with strong cough. Recently followed up with urogynecology and ultimately decided to pursue urethral bulking. Plan to perform cystometrogram prior to procedure to visualize leakage.

## 2021-11-13 NOTE — Assessment & Plan Note (Signed)
Patient presenting with a lesion on right side of nose that has been present for at least 1 year. She states that she first noticed the lesion about a year ago and reports that it has progressively grown in size. She thinks that the lesion may have been present for longer than this time as she saw a picture from 6 years ago where she had a tiny bump on her nose, unclear if it is the same lesion. The lesion does not bleed, itch, or cause pain. It has not produced any discharge. No family history of skin cancers. She does stay outdoors frequently, mainly as she loves to garden and work in her backyard. She does not apply sunscreen regularly.  On physical exam, there is a flesh-colored papule on the right side of nose with mild central umbilication (possibly overlying skin pore). No overlying crusting/scabbing, telangiectasias, or discoloration.   Considered the possibility of molluscum contagiosum but the central umbilication appears to possibly be an overlying skin pore. Given duration, progressive growth, and sunlight exposure, would like to rule out skin cancer (mainly basal cell carcinoma as this does not appear consistent with SCC or melanoma). Given sensitive location, will refer to dermatology for further evaluation and possible need for biopsy.  Plan: -referral to dermatology

## 2021-11-13 NOTE — Assessment & Plan Note (Addendum)
Patient with class 1 obesity given BMI 33.32. She is at increased risk for dyslipidemia and insulin resistance so will obtain an A1c and lipid panel today for screening.  -f/u A1c -f/u lipid panel  ADDENDUM: A1c of 6.0%, suggestive of prediabetes. Lipid panel with total cholesterol 217, HDL 35, LDL 121. 10-year ASCVD risk of 1.2%. She would like to trial lifestyle modifications first, which is reasonable. Discussed dietary changes and moderate intensity exercise for >150 minutes/week. She confirms understanding.

## 2021-11-13 NOTE — Assessment & Plan Note (Signed)
Diagnosed in March 2023, has since completed treatment with flagyl.

## 2021-11-13 NOTE — Assessment & Plan Note (Signed)
Patient with mildly elevated liver enzyme (ALT) noted on CMP from 2017. No follow-up CMP or additional testing noted to further evaluate, so will obtain a CMP today to see if this has persisted or resolved.  -f/u CMP

## 2021-11-13 NOTE — Progress Notes (Signed)
CC: establish care with PCP  HPI:  [name] is a 41 y.o. female with history listed below presenting to the [facility] to establish care with new PCP. Please see individualized problem based charting for full HPI.  Past Medical History:  Diagnosis Date   Bacterial vaginosis 11/13/2021   Gestational anemia    Obesity (BMI 30-39.9)    SUI (stress urinary incontinence, female)    Past Surgical History:  Procedure Laterality Date   NO PAST SURGERIES     Current Outpatient Medications on File Prior to Visit  Medication Sig Dispense Refill   levonorgestrel (MIRENA) 20 MCG/24HR IUD 1 each by Intrauterine route once.     No current facility-administered medications on file prior to visit.   No Known Allergies  Family History  Problem Relation Age of Onset   Hypertension Paternal Grandmother    Diabetes Paternal Grandmother    Heart disease Paternal Grandmother    Hearing loss Maternal Grandmother        with age   Diabetes Father    Diabetes Mother    Asthma Neg Hx    Stroke Neg Hx    Social History   Socioeconomic History   Marital status: Single    Spouse name: Not on file   Number of children: 4   Years of education: Not on file   Highest education level: High school graduate  Occupational History   Not on file  Tobacco Use   Smoking status: Never   Smokeless tobacco: Never  Vaping Use   Vaping Use: Never used  Substance and Sexual Activity   Alcohol use: No   Drug use: No   Sexual activity: Yes    Partners: Male    Birth control/protection: I.U.D.  Other Topics Concern   Not on file  Social History Narrative   Not on file   Social Determinants of Health   Financial Resource Strain: Not on file  Food Insecurity: Not on file  Transportation Needs: No Transportation Needs (01/25/2020)   PRAPARE - Hydrologist (Medical): No    Lack of Transportation (Non-Medical): No  Physical Activity: Not on file  Stress: Not on file   Social Connections: Not on file  Intimate Partner Violence: Not on file   Review of Systems:  Negative aside from that listed in individualized problem based charting.  Physical Exam:  Vitals:   11/13/21 1331  BP: (!) 106/59  Pulse: 87  Temp: 98.4 F (36.9 C)  TempSrc: Oral  SpO2: 97%  Weight: 188 lb 1.6 oz (85.3 kg)  Height: 5\' 3"  (1.6 m)   Physical Exam Constitutional:      Appearance: Normal appearance. She is obese. She is not ill-appearing.  HENT:     Head: Normocephalic and atraumatic.     Nose: No congestion or rhinorrhea.     Comments: Patient with flesh-colored papule on right side of nose with mild central umbilication (possibly overlying skin pore). No overlying crusting/scabbing or telangiectasia.     Mouth/Throat:     Mouth: Mucous membranes are moist.     Pharynx: Oropharynx is clear. No oropharyngeal exudate.  Eyes:     Extraocular Movements: Extraocular movements intact.     Conjunctiva/sclera: Conjunctivae normal.     Pupils: Pupils are equal, round, and reactive to light.  Cardiovascular:     Rate and Rhythm: Normal rate and regular rhythm.     Pulses: Normal pulses.     Heart sounds: Normal  heart sounds. No murmur heard.    No friction rub. No gallop.  Abdominal:     General: Bowel sounds are normal. There is no distension.     Palpations: Abdomen is soft.     Tenderness: There is no abdominal tenderness.  Musculoskeletal:        General: No swelling. Normal range of motion.     Cervical back: Normal range of motion.  Skin:    General: Skin is warm and dry.  Neurological:     General: No focal deficit present.     Mental Status: She is alert and oriented to person, place, and time.  Psychiatric:        Mood and Affect: Mood normal.        Behavior: Behavior normal.       Assessment & Plan:   See Encounters Tab for problem based charting.  Patient discussed with Dr.  Sol Blazing

## 2021-11-13 NOTE — Patient Instructions (Signed)
Ms. Totten,  It was a pleasure seeing you in the clinic today.   I have placed a referral to dermatology so that they can take a closer look at the bump on your nose. They will call you to schedule an appointment. I have ordered some labs today as we discussed. I will call you with the results once I have them. Please come back in 3 months for a follow up visit.  Please call our clinic at [phone] if you have any questions or concerns. The best time to call is Monday-Friday from 9am-4pm, but there is someone available 24/7 at the same number. If you need medication refills, please notify your pharmacy one week in advance and they will send [place] a request.   Thank you for letting us take part in your care. We look forward to seeing you next time!

## 2021-11-13 NOTE — Assessment & Plan Note (Addendum)
Patient reports that she had anemia during her last pregnancy. It was never severe but she never followed up on this to see if it resolved thereafter. Will obtain a CBC to assess for resolution of anemia.  -f/u CBC  ADDENDUM: CBC without anemia.

## 2021-11-14 LAB — CBC
Hematocrit: 34.3 % (ref 34.0–46.6)
Hemoglobin: 12 g/dL (ref 11.1–15.9)
MCH: 29.8 pg (ref 26.6–33.0)
MCHC: 35 g/dL (ref 31.5–35.7)
MCV: 85 fL (ref 79–97)
Platelets: 279 10*3/uL (ref 150–450)
RBC: 4.03 x10E6/uL (ref 3.77–5.28)
RDW: 11.8 % (ref 11.7–15.4)
WBC: 5.3 10*3/uL (ref 3.4–10.8)

## 2021-11-14 LAB — CMP14 + ANION GAP
ALT: 33 IU/L — ABNORMAL HIGH (ref 0–32)
AST: 26 IU/L (ref 0–40)
Albumin/Globulin Ratio: 1.9 (ref 1.2–2.2)
Albumin: 4.4 g/dL (ref 3.9–4.9)
Alkaline Phosphatase: 91 IU/L (ref 44–121)
Anion Gap: 17 mmol/L (ref 10.0–18.0)
BUN/Creatinine Ratio: 25 — ABNORMAL HIGH (ref 9–23)
BUN: 14 mg/dL (ref 6–24)
Bilirubin Total: 0.2 mg/dL (ref 0.0–1.2)
CO2: 19 mmol/L — ABNORMAL LOW (ref 20–29)
Calcium: 8.8 mg/dL (ref 8.7–10.2)
Chloride: 105 mmol/L (ref 96–106)
Creatinine, Ser: 0.57 mg/dL (ref 0.57–1.00)
Globulin, Total: 2.3 g/dL (ref 1.5–4.5)
Glucose: 98 mg/dL (ref 70–99)
Potassium: 4 mmol/L (ref 3.5–5.2)
Sodium: 141 mmol/L (ref 134–144)
Total Protein: 6.7 g/dL (ref 6.0–8.5)
eGFR: 117 mL/min/{1.73_m2} (ref >59)

## 2021-11-14 LAB — LIPID PANEL
Chol/HDL Ratio: 6.2 ratio — ABNORMAL HIGH (ref 0.0–4.4)
Cholesterol, Total: 217 mg/dL — ABNORMAL HIGH (ref 100–199)
HDL: 35 mg/dL — ABNORMAL LOW (ref >39)
LDL Chol Calc (NIH): 121 mg/dL — ABNORMAL HIGH (ref 0–99)
Triglycerides: 349 mg/dL — ABNORMAL HIGH (ref 0–149)
VLDL Cholesterol Cal: 61 mg/dL — ABNORMAL HIGH (ref 5–40)

## 2021-11-14 LAB — HEMOGLOBIN A1C
Est. average glucose Bld gHb Est-mCnc: 126 mg/dL
Hgb A1c MFr Bld: 6 % — ABNORMAL HIGH (ref 4.8–5.6)

## 2021-11-14 NOTE — Progress Notes (Signed)
Internal Medicine Clinic Attending  Case discussed with Dr. Jinwala  At the time of the visit.  We reviewed the resident's history and exam and pertinent patient test results.  I agree with the assessment, diagnosis, and plan of care documented in the resident's note.  

## 2021-11-19 DIAGNOSIS — Z419 Encounter for procedure for purposes other than remedying health state, unspecified: Secondary | ICD-10-CM | POA: Diagnosis not present

## 2021-11-20 DIAGNOSIS — R7303 Prediabetes: Secondary | ICD-10-CM | POA: Insufficient documentation

## 2021-11-20 DIAGNOSIS — E785 Hyperlipidemia, unspecified: Secondary | ICD-10-CM | POA: Insufficient documentation

## 2021-11-24 NOTE — Progress Notes (Signed)
A request to [facility] for in office Urethral bulking has been faxed with clinical documentation. Awaiting a response.

## 2021-11-26 ENCOUNTER — Ambulatory Visit: Payer: Commercial Managed Care - HMO | Admitting: Physical Therapy

## 2021-11-27 ENCOUNTER — Telehealth: Payer: Self-pay

## 2021-11-27 NOTE — Telephone Encounter (Signed)
Attempted to contact the patient re: Urethral Bulking PA could not be approved. Per [facility] pt is in the process of a health plan change  Pt was not available. No VM available

## 2021-12-01 ENCOUNTER — Ambulatory Visit: Payer: Commercial Managed Care - HMO | Attending: Obstetrics and Gynecology | Admitting: Physical Therapy

## 2021-12-01 DIAGNOSIS — R278 Other lack of coordination: Secondary | ICD-10-CM | POA: Insufficient documentation

## 2021-12-19 DIAGNOSIS — Z419 Encounter for procedure for purposes other than remedying health state, unspecified: Secondary | ICD-10-CM | POA: Diagnosis not present

## 2021-12-22 NOTE — Progress Notes (Unsigned)
Verbal consent was obtained to perform simple CMG procedure:   Prolapse was reduced using 2 large cotton swabs. Urethra was prepped with betadine and a 50F catheter was placed and bladder was drained completely. The bladder was then backfilled with sterile water by gravity.  First sensation: 170 First Desire: 225 Strong Desire: 260 Capacity: 365 Cough stress test was positive. Valsalva stress test was negative, patient did not seem to be able to push hard during valsalva.  She was was allowed to void on her own.   Interpretation: CMG showed within normal limits sensation, and normal cystometric capacity. Findings positive for stress incontinence, negative for detrusor overactivity.

## 2021-12-24 ENCOUNTER — Ambulatory Visit (INDEPENDENT_AMBULATORY_CARE_PROVIDER_SITE_OTHER): Payer: Commercial Managed Care - HMO | Admitting: Obstetrics and Gynecology

## 2021-12-24 ENCOUNTER — Encounter: Payer: Self-pay | Admitting: Obstetrics and Gynecology

## 2021-12-24 VITALS — BP 102/68 | HR 83

## 2021-12-24 DIAGNOSIS — R350 Frequency of micturition: Secondary | ICD-10-CM

## 2021-12-24 DIAGNOSIS — N393 Stress incontinence (female) (male): Secondary | ICD-10-CM

## 2021-12-24 NOTE — Patient Instructions (Addendum)

## 2022-01-08 ENCOUNTER — Ambulatory Visit: Payer: Commercial Managed Care - HMO

## 2022-01-08 ENCOUNTER — Ambulatory Visit: Payer: Commercial Managed Care - HMO | Admitting: Obstetrics and Gynecology

## 2022-01-19 DIAGNOSIS — Z419 Encounter for procedure for purposes other than remedying health state, unspecified: Secondary | ICD-10-CM | POA: Diagnosis not present

## 2022-01-30 NOTE — Progress Notes (Signed)
Pa for Caitlin [name] is a 42 y.o. female submitted through [place] on 01/29/22. Response came back as " No longer enrolled in [employer]"  Response has been scanned. --- 01/30/22 A new PA request sent to [facility] was faxed to [phone]. Awaiting response.

## 2022-02-03 DIAGNOSIS — D485 Neoplasm of uncertain behavior of skin: Secondary | ICD-10-CM | POA: Diagnosis not present

## 2022-02-10 ENCOUNTER — Ambulatory Visit: Payer: Commercial Managed Care - HMO | Admitting: Obstetrics and Gynecology

## 2022-02-13 ENCOUNTER — Encounter: Payer: Self-pay | Admitting: Student

## 2022-02-13 ENCOUNTER — Ambulatory Visit (INDEPENDENT_AMBULATORY_CARE_PROVIDER_SITE_OTHER): Payer: Medicaid Other | Admitting: Student

## 2022-02-13 VITALS — BP 101/49 | HR 74 | Temp 98.4°F | Ht 63.0 in | Wt 186.5 lb

## 2022-02-13 DIAGNOSIS — Z23 Encounter for immunization: Secondary | ICD-10-CM | POA: Diagnosis not present

## 2022-02-13 DIAGNOSIS — Z6830 Body mass index (BMI) 30.0-30.9, adult: Secondary | ICD-10-CM

## 2022-02-13 DIAGNOSIS — E7800 Pure hypercholesterolemia, unspecified: Secondary | ICD-10-CM

## 2022-02-13 DIAGNOSIS — R7303 Prediabetes: Secondary | ICD-10-CM

## 2022-02-13 DIAGNOSIS — R238 Other skin changes: Secondary | ICD-10-CM | POA: Diagnosis not present

## 2022-02-13 DIAGNOSIS — E669 Obesity, unspecified: Secondary | ICD-10-CM | POA: Diagnosis not present

## 2022-02-13 NOTE — Patient Instructions (Addendum)
Caitlin [name],  It was a pleasure seeing you in the clinic today.   As we discussed, please do at least 2.5 hours of exercise every week where you are breaking a sweat so that we can focus on weight loss. You got your flu shot today. Please come back in 3 months for your next A1c check  Please call our clinic at [phone] if you have any questions or concerns. The best time to call is Monday-Friday from 9am-4pm, but there is someone available 24/7 at the same number. If you need medication refills, please notify your pharmacy one week in advance and they will send [place] a request.   Thank you for letting us take part in your care. We look forward to seeing you next time!

## 2022-02-13 NOTE — Assessment & Plan Note (Signed)
Last lipid panel with total cholesterol 217, LDL 121, HDL 35. Her 10-year ASCVD risk is 1.1%. Have counseled on dietary modifications to help with better control of HLD and have also counseled on moderate intensity exercise. No indication for statin therapy at this time.

## 2022-02-13 NOTE — Progress Notes (Signed)
   CC: f/u obesity  HPI:  Caitlin [name] is a 42 y.o. female with history listed below presenting to the [facility] for f/u obesity. Please see individualized problem based charting for full HPI.  Past Medical History:  Diagnosis Date   Anemia affecting pregnancy 11/13/2021   Bacterial vaginosis 11/13/2021   Elevated liver enzymes 11/13/2021   Gestational anemia    Obesity (BMI 30-39.9)    SUI (stress urinary incontinence, female)     Review of Systems:  Negative aside from that listed in individualized problem based charting.  Physical Exam:  Vitals:   02/13/22 0916  BP: (!) 101/49  Pulse: 74  Temp: 98.4 F (36.9 C)  TempSrc: Oral  SpO2: 99%  Weight: 186 lb 8 oz (84.6 kg)  Height: 5\' 3"  (1.6 m)   Physical Exam Constitutional:      Appearance: Normal appearance. She is obese. She is not ill-appearing.  HENT:     Mouth/Throat:     Mouth: Mucous membranes are moist.     Pharynx: Oropharynx is clear.  Eyes:     Extraocular Movements: Extraocular movements intact.     Conjunctiva/sclera: Conjunctivae normal.     Pupils: Pupils are equal, round, and reactive to light.  Cardiovascular:     Rate and Rhythm: Normal rate and regular rhythm.     Heart sounds: Normal heart sounds. No murmur heard.    No gallop.  Pulmonary:     Effort: Pulmonary effort is normal.     Breath sounds: Normal breath sounds. No wheezing, rhonchi or rales.  Abdominal:     General: Bowel sounds are normal. There is no distension.     Palpations: Abdomen is soft.     Tenderness: There is no abdominal tenderness.  Musculoskeletal:        General: No swelling. Normal range of motion.  Skin:    General: Skin is warm and dry.  Neurological:     General: No focal deficit present.     Mental Status: She is alert and oriented to person, place, and time.  Psychiatric:        Mood and Affect: Mood normal.        Behavior: Behavior normal.      Assessment & Plan:   See Encounters Tab for problem  based charting.  Patient discussed with Dr. Evette Doffing

## 2022-02-13 NOTE — Assessment & Plan Note (Signed)
Underwent biopsy with dermatology and was told that papule was benign in etiology. Unable to see records, but this is reassuring.

## 2022-02-13 NOTE — Assessment & Plan Note (Signed)
Last A1c of 6.0% in 10/2021. We discussed modifiable risk factors and discussed importance of moderate intensity exercise for at least 2.5 hours/week to help with better glycemic control and for weight loss given obesity.  Plan: -moderate intensity exercise -continue lifestyle modifications -f/u in 3 months for repeat A1c

## 2022-02-13 NOTE — Assessment & Plan Note (Signed)
Patient living with obesity. BMI currently 33.04. She has had a 2 lb weight loss since last visit but has not actively been exercising. Discussed importance of moderate intensity exercise for at least 2.5 hours/week to help with weight loss especially given concomitant prediabetes and HLD. She understands and will try to incorporate this into her lifestyle.   Plan: -moderate intensity exercise

## 2022-02-16 NOTE — Progress Notes (Signed)
Internal Medicine Clinic Attending  Case discussed with Dr. Jinwala  At the time of the visit.  We reviewed the resident's history and exam and pertinent patient test results.  I agree with the assessment, diagnosis, and plan of care documented in the resident's note.  

## 2022-02-19 DIAGNOSIS — Z419 Encounter for procedure for purposes other than remedying health state, unspecified: Secondary | ICD-10-CM | POA: Diagnosis not present

## 2022-03-03 ENCOUNTER — Encounter: Payer: Self-pay | Admitting: Obstetrics and Gynecology

## 2022-03-03 ENCOUNTER — Ambulatory Visit (INDEPENDENT_AMBULATORY_CARE_PROVIDER_SITE_OTHER): Payer: Medicaid Other | Admitting: Obstetrics and Gynecology

## 2022-03-03 VITALS — BP 103/62 | HR 69 | Ht 63.0 in | Wt 183.6 lb

## 2022-03-03 DIAGNOSIS — Z30431 Encounter for routine checking of intrauterine contraceptive device: Secondary | ICD-10-CM

## 2022-03-03 NOTE — Progress Notes (Signed)
Pt presents for IUD removal and reinsertion. IUD placed 2017.

## 2022-03-03 NOTE — Progress Notes (Signed)
   GYNECOLOGY OFFICE VISIT NOTE  History:   Caitlin [name] is a 42 y.o. [phone] here today for initially IUD removal and reinsertion but has had it in 7 years and since her last check in was approved for 8 years. She would like to wait until it is due for removal before. This is her third IUD and she has been very happy with them.   She denies any abnormal vaginal discharge, bleeding, pelvic pain or other concerns.     Past Medical History:  Diagnosis Date   Anemia affecting pregnancy 11/13/2021   Bacterial vaginosis 11/13/2021   Elevated liver enzymes 11/13/2021   Gestational anemia    Obesity (BMI 30-39.9)    SUI (stress urinary incontinence, female)     Past Surgical History:  Procedure Laterality Date   NO PAST SURGERIES      The following portions of the patient's history were reviewed and updated as appropriate: allergies, current medications, past family history, past medical history, past social history, past surgical history and problem list.   Health Maintenance:   Normal pap and negative HRHPV :   Diagnosis  Date Value Ref Range Status  04/03/2021   Final   - Negative for intraepithelial lesion or malignancy (NILM)     Normal mammogram on 04/04/2021.   Review of Systems:  Pertinent items noted in HPI and remainder of comprehensive ROS otherwise negative.  Physical Exam:  BP 103/62   Pulse 69   Ht 5\' 3"  (1.6 m)   Wt 183 lb 9.6 oz (83.3 kg)   BMI 32.52 kg/m  CONSTITUTIONAL: Well-developed, well-nourished female in no acute distress.  HEENT:  Normocephalic, atraumatic. External right and left ear normal. No scleral icterus.  NECK: Normal range of motion, supple, no masses noted on observation SKIN: No rash noted. Not diaphoretic. No erythema. No pallor. MUSCULOSKELETAL: Normal range of motion. No edema noted. NEUROLOGIC: Alert and oriented to person, place, and time. Normal muscle tone coordination. No cranial nerve deficit noted. PSYCHIATRIC: Normal mood and  affect. Normal behavior. Normal judgment and thought content.  PELVIC: Deferred  Labs and Imaging No results found for this or any previous visit (from the past 168 hour(s)). No results found.  Assessment and Plan:   1. IUD check up No issues with IUD. Will do removal/reinsertion next year.  Not yet due for annual.    Routine preventative health maintenance measures emphasized. Please refer to After Visit Summary for other counseling recommendations.   Return in about 14 months (around 05/02/2023) for IUD exchange.  Radene Gunning, MD, [place] for [facility], [place]

## 2022-03-09 ENCOUNTER — Encounter: Payer: Self-pay | Admitting: *Deleted

## 2022-03-10 ENCOUNTER — Ambulatory Visit: Payer: Medicaid Other | Admitting: Obstetrics and Gynecology

## 2022-03-20 DIAGNOSIS — Z419 Encounter for procedure for purposes other than remedying health state, unspecified: Secondary | ICD-10-CM | POA: Diagnosis not present

## 2022-03-27 IMAGING — MG MM DIGITAL SCREENING BILAT W/ TOMO AND CAD
6 of 10 series · 6 of 30 positions shown · non-contrast
Comparison: Previous exam(s).

CLINICAL DATA: Screening.

EXAM:
DIGITAL SCREENING BILATERAL MAMMOGRAM WITH TOMOSYNTHESIS AND CAD
TECHNIQUE: Bilateral screening digital craniocaudal and mediolateral oblique
mammograms were obtained. Bilateral screening digital breast
tomosynthesis was performed. The images were evaluated with
computer-aided detection.

[R MLO synth-2D (1 of 2)]
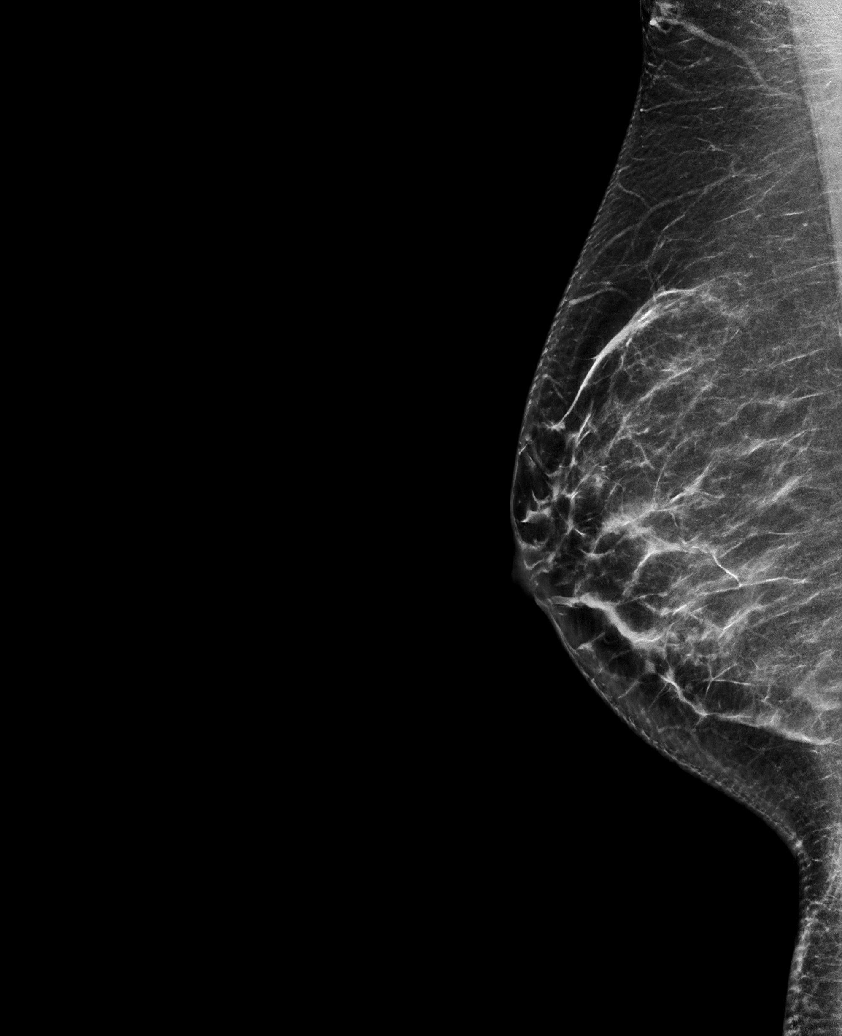

[R CC synth-2D]
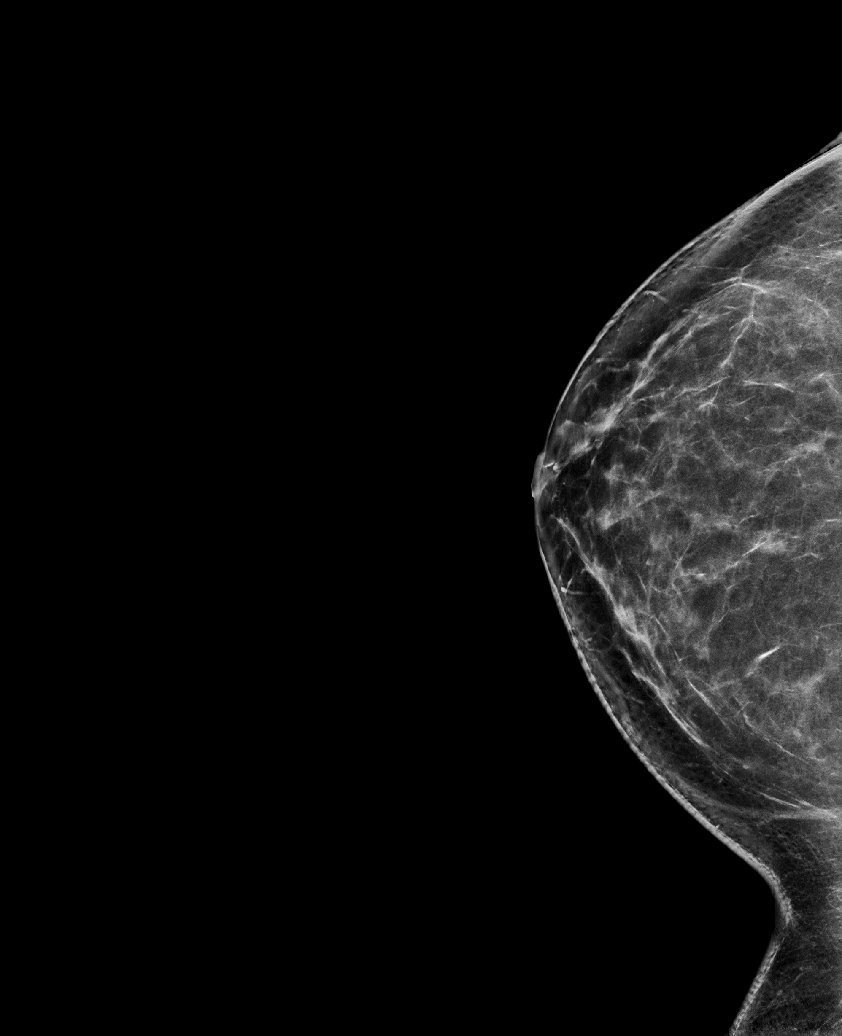

[L MLO synth-2D]
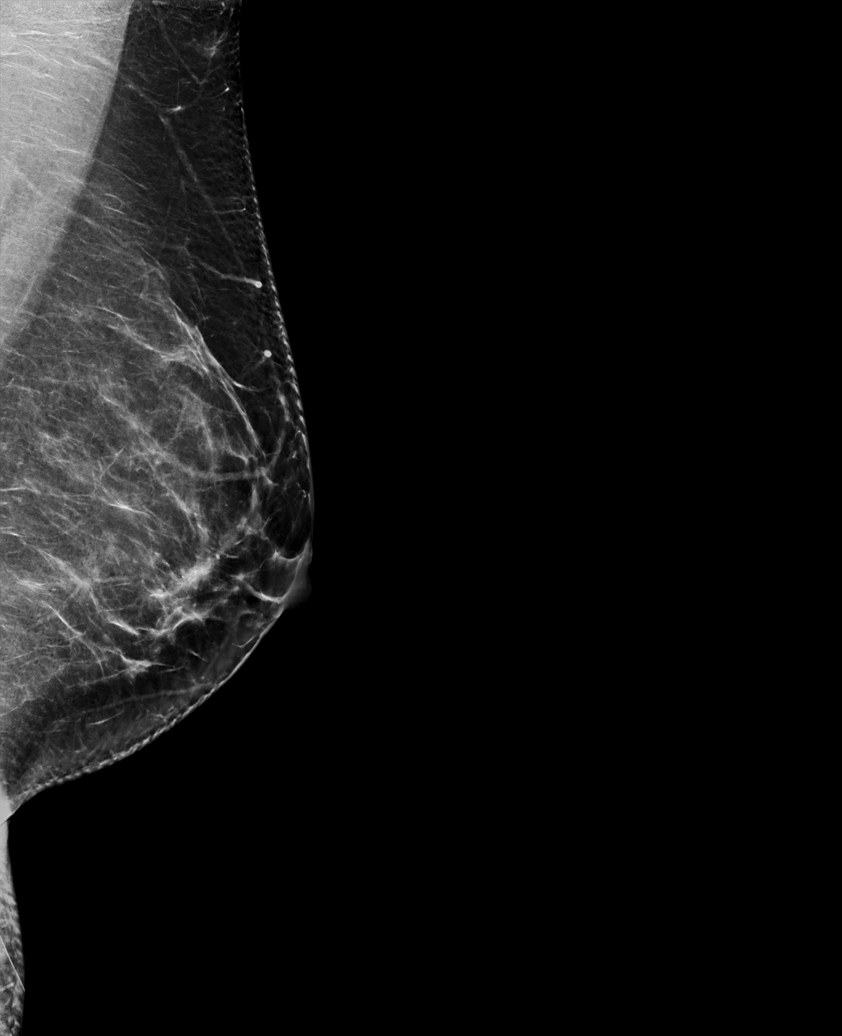

[R MLO synth-2D (2 of 2)]
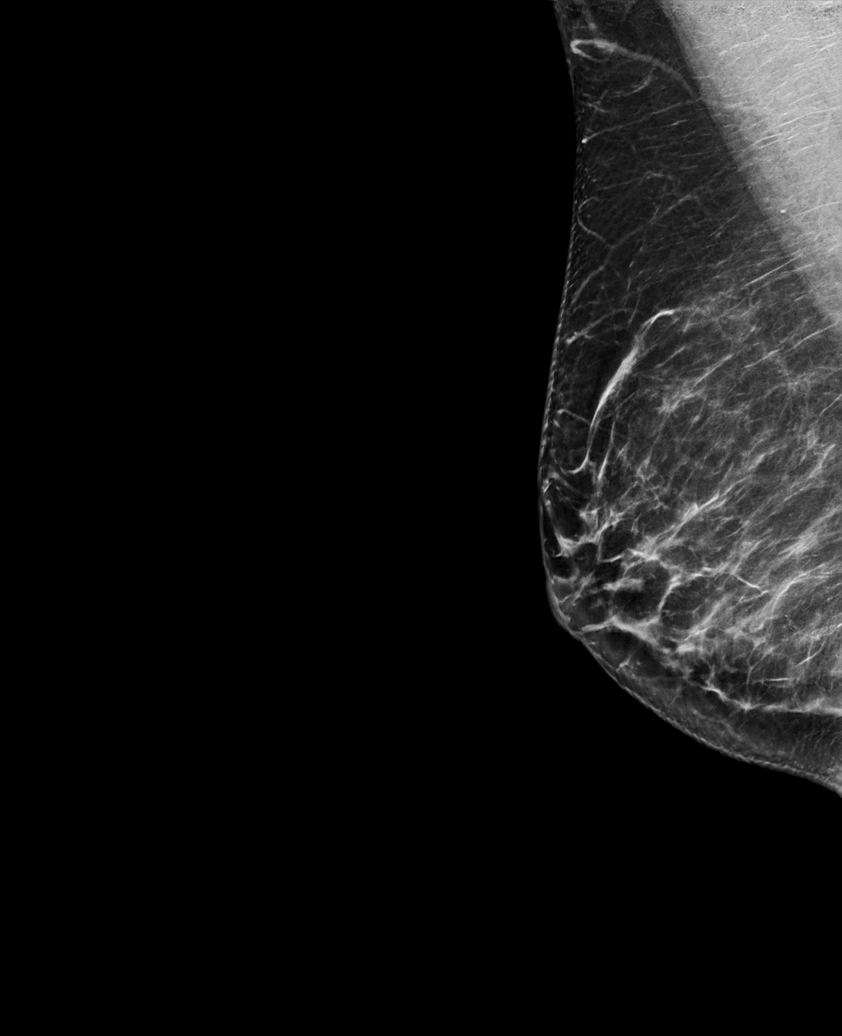

[L CC synth-2D]
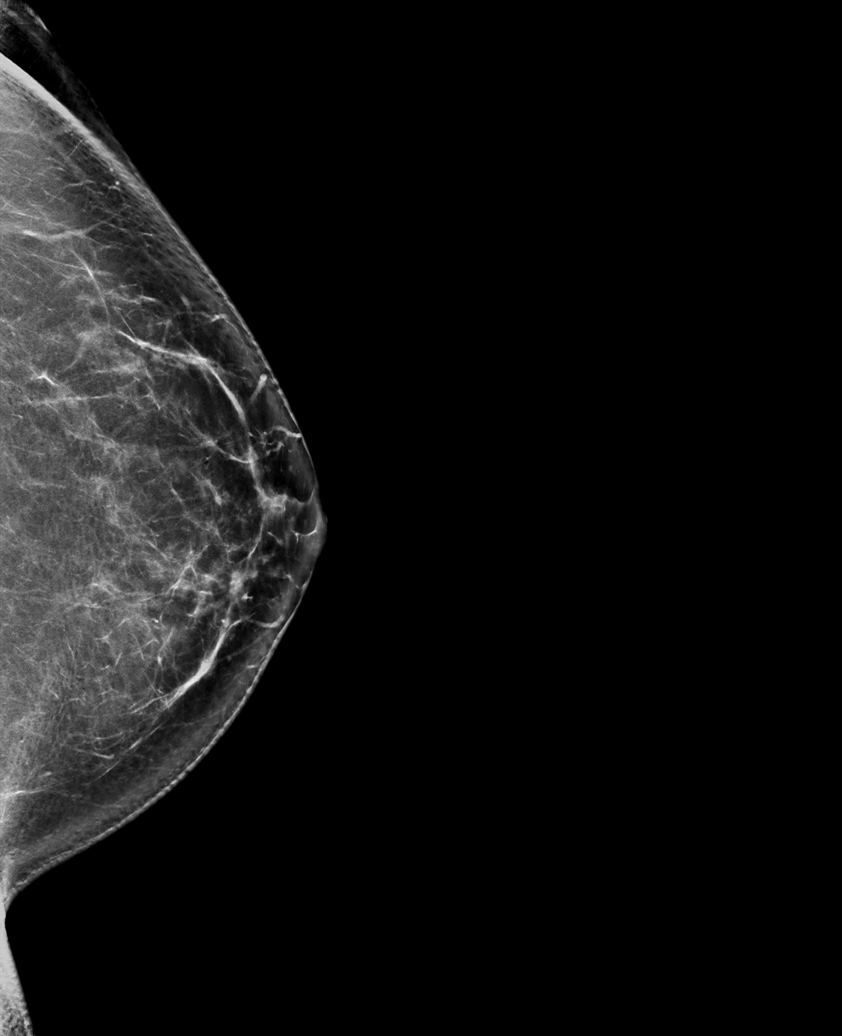

[R CC tomo · tomo slice 43/84.0]
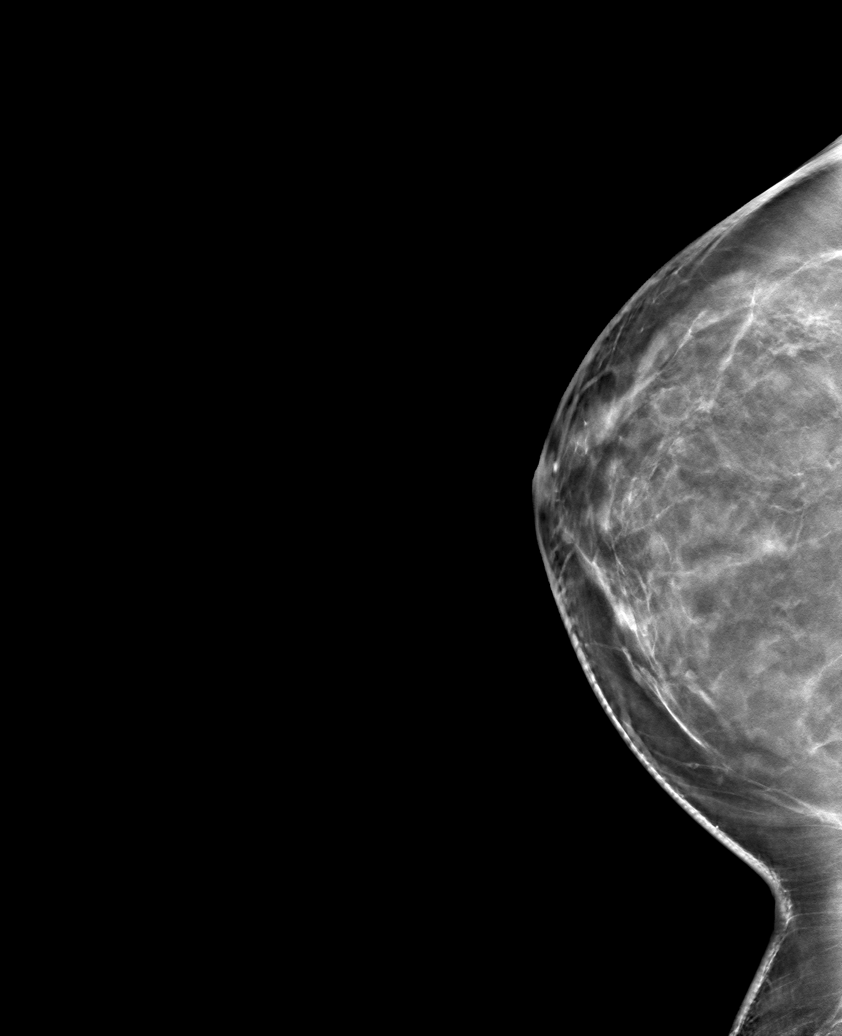

[6 of 30 positions shown; findings below may reference images not displayed]

ACR Breast Density Category b: There are scattered areas of
fibroglandular density.
FINDINGS: There are no findings suspicious for malignancy.
IMPRESSION: No mammographic evidence of malignancy. A result letter of this
screening mammogram will be mailed directly to the patient.

RECOMMENDATION:
Screening mammogram in one year. (Code:51-O-LD2)

BI-RADS CATEGORY  1: Negative.

## 2022-04-20 DIAGNOSIS — Z419 Encounter for procedure for purposes other than remedying health state, unspecified: Secondary | ICD-10-CM | POA: Diagnosis not present

## 2022-05-18 ENCOUNTER — Encounter: Payer: 59 | Admitting: Student

## 2022-05-20 DIAGNOSIS — Z419 Encounter for procedure for purposes other than remedying health state, unspecified: Secondary | ICD-10-CM | POA: Diagnosis not present

## 2022-06-20 DIAGNOSIS — Z419 Encounter for procedure for purposes other than remedying health state, unspecified: Secondary | ICD-10-CM | POA: Diagnosis not present

## 2022-06-22 ENCOUNTER — Encounter: Payer: Self-pay | Admitting: *Deleted

## 2022-07-20 DIAGNOSIS — Z419 Encounter for procedure for purposes other than remedying health state, unspecified: Secondary | ICD-10-CM | POA: Diagnosis not present

## 2022-08-20 DIAGNOSIS — Z419 Encounter for procedure for purposes other than remedying health state, unspecified: Secondary | ICD-10-CM | POA: Diagnosis not present

## 2022-08-27 ENCOUNTER — Other Ambulatory Visit (HOSPITAL_COMMUNITY)
Admission: RE | Admit: 2022-08-27 | Discharge: 2022-08-27 | Disposition: A | Discharge status: Home / Self Care | Payer: 59 | Source: Ambulatory Visit | Attending: Obstetrics and Gynecology | Admitting: Obstetrics and Gynecology

## 2022-08-27 ENCOUNTER — Ambulatory Visit (INDEPENDENT_AMBULATORY_CARE_PROVIDER_SITE_OTHER): Payer: 59 | Admitting: Obstetrics and Gynecology

## 2022-08-27 ENCOUNTER — Encounter: Payer: Self-pay | Admitting: Obstetrics and Gynecology

## 2022-08-27 VITALS — BP 104/65 | HR 73 | Ht 64.0 in | Wt 181.0 lb

## 2022-08-27 DIAGNOSIS — Z30431 Encounter for routine checking of intrauterine contraceptive device: Secondary | ICD-10-CM | POA: Diagnosis not present

## 2022-08-27 DIAGNOSIS — Z113 Encounter for screening for infections with a predominantly sexual mode of transmission: Secondary | ICD-10-CM | POA: Diagnosis not present

## 2022-08-27 DIAGNOSIS — Z01419 Encounter for gynecological examination (general) (routine) without abnormal findings: Secondary | ICD-10-CM

## 2022-08-27 DIAGNOSIS — Z1339 Encounter for screening examination for other mental health and behavioral disorders: Secondary | ICD-10-CM

## 2022-08-27 NOTE — Progress Notes (Addendum)
42 y.o. GYN presents for AEX/STD Screening. Mammogram is scheduled for 09/14/22.  Last PAP 04/03/2021

## 2022-08-27 NOTE — Progress Notes (Signed)
GYNECOLOGY ANNUAL PREVENTATIVE CARE ENCOUNTER NOTE  History:     Caitlin [name] is a 42 y.o. [phone] female here for a routine annual gynecologic exam.  Current complaints: none.   Denies abnormal vaginal bleeding, discharge, pelvic pain, problems with intercourse or other gynecologic concerns.    Gynecologic History No LMP recorded. (Menstrual status: IUD). Contraception: IUD Last Pap: 3/23. Results were: normal with negative HPV Last mammogram: 3/23. Results were: normal  Obstetric History OB History  Gravida Para Term Preterm AB Living  4 4 4  0 0 4  SAB IAB Ectopic Multiple Live Births  0 0 0 0 4    # Outcome Date GA Lbr Len/2nd Weight Sex Type Anes PTL Lv  4 Term 01/23/15 [redacted]w[redacted]d 181:54 / 01:19 9 lb 14.6 oz (4.495 kg) F Vag-Spont EPI  LIV  3 Term 02/07/04 [redacted]w[redacted]d  8 lb 12 oz (3.969 kg) M Vag-Spont None N LIV  2 Term 11/04/01 [redacted]w[redacted]d  9 lb (4.082 kg) M Vag-Spont EPI N LIV  1 Term 08/19/99 [redacted]w[redacted]d  7 lb (3.175 kg) F Vag-Spont None N LIV    Past Medical History:  Diagnosis Date   Anemia affecting pregnancy 11/13/2021   Bacterial vaginosis 11/13/2021   Elevated liver enzymes 11/13/2021   Gestational anemia    Obesity (BMI 30-39.9)    SUI (stress urinary incontinence, female)     Past Surgical History:  Procedure Laterality Date   NO PAST SURGERIES      Current Outpatient Medications on File Prior to Visit  Medication Sig Dispense Refill   levonorgestrel (MIRENA) 20 MCG/24HR IUD 1 each by Intrauterine route once.     No current facility-administered medications on file prior to visit.    No Known Allergies  Social History:  reports that she has never smoked. She has never used smokeless tobacco. She reports that she does not drink alcohol and does not use drugs.  Family History  Problem Relation Age of Onset   Hypertension Paternal Grandmother    Diabetes Paternal Grandmother    Heart disease Paternal Grandmother    Hearing loss Maternal Grandmother        with age    Diabetes Father    Diabetes Mother    Asthma Neg Hx    Stroke Neg Hx     The following portions of the patient's history were reviewed and updated as appropriate: allergies, current medications, past family history, past medical history, past social history, past surgical history and problem list.  Review of Systems Pertinent items noted in HPI and remainder of comprehensive ROS otherwise negative.  Physical Exam:  BP 104/65   Pulse 73   Ht 5\' 4"  (1.626 m)   Wt 181 lb (82.1 kg)   BMI 31.07 kg/m  CONSTITUTIONAL: Well-developed, well-nourished female in no acute distress.  HENT:  Normocephalic, atraumatic, External right and left ear normal. Oropharynx is clear and moist EYES: Conjunctivae and EOM are normal. Pupils are equal, round, and reactive to light. No scleral icterus.  NECK: Normal range of motion, supple, no masses.  Normal thyroid.  SKIN: Skin is warm and dry. No rash noted. Not diaphoretic. No erythema. No pallor. MUSCULOSKELETAL: Normal range of motion. No tenderness.  No cyanosis, clubbing, or edema.  2+ distal pulses. NEUROLOGIC: Alert and oriented to person, place, and time. Normal reflexes, muscle tone coordination.  PSYCHIATRIC: Normal mood and affect. Normal behavior. Normal judgment and thought content. CARDIOVASCULAR: Normal heart rate noted, regular rhythm RESPIRATORY: Clear to auscultation bilaterally.  Effort and breath sounds normal, no problems with respiration noted. BREASTS: Symmetric in size. No masses, tenderness, skin changes, nipple drainage, or lymphadenopathy bilaterally. Performed in the presence of a chaperone. ABDOMEN: Soft, no distention noted.  No tenderness, rebound or guarding.  PELVIC: Normal appearing external genitalia and urethral meatus; normal appearing vaginal mucosa and cervix.  No abnormal discharge noted.  Pap smear obtained.  Normal uterine size, no other palpable masses, no uterine or adnexal tenderness.  Performed in the presence of a  chaperone.   Assessment and Plan:    1. Women's annual routine gynecological examination [Z01.419] Normal annual exam  2. Routine screening for STI (sexually transmitted infection) Per pt request - Cervicovaginal ancillary only( [place]) - HIV antibody (with reflex) - RPR - Hepatitis C Antibody - Hepatitis B Surface AntiGEN  3. IUD check up IUD strings not well seen, reviewed chart, strings not seen on last physical exam, but IUD was seen on ultrasound in 2022.  Pt is still amenorrheic so device likely is still in place.  Pt advised of exam findings.  Will attempt removal/reinsertion 04/2023  Will follow up STI labs Mammogram scheduled Routine preventative health maintenance measures emphasized. Please refer to After Visit Summary for other counseling recommendations.     IUD removal/replacement 04/2023 Mariel Aloe, MD, FACOG Obstetrician & Gynecologist, [facility] for [facility], [facility]

## 2022-09-09 ENCOUNTER — Other Ambulatory Visit: Payer: Self-pay | Admitting: Obstetrics

## 2022-09-09 DIAGNOSIS — Z1231 Encounter for screening mammogram for malignant neoplasm of breast: Secondary | ICD-10-CM

## 2022-09-10 ENCOUNTER — Ambulatory Visit (HOSPITAL_BASED_OUTPATIENT_CLINIC_OR_DEPARTMENT_OTHER)
Admission: RE | Admit: 2022-09-10 | Discharge: 2022-09-10 | Disposition: A | Discharge status: Home / Self Care | Payer: 59 | Source: Ambulatory Visit | Attending: Obstetrics | Admitting: Obstetrics

## 2022-09-10 DIAGNOSIS — Z1231 Encounter for screening mammogram for malignant neoplasm of breast: Secondary | ICD-10-CM | POA: Diagnosis not present

## 2022-09-20 DIAGNOSIS — Z419 Encounter for procedure for purposes other than remedying health state, unspecified: Secondary | ICD-10-CM | POA: Diagnosis not present

## 2022-10-20 DIAGNOSIS — Z419 Encounter for procedure for purposes other than remedying health state, unspecified: Secondary | ICD-10-CM | POA: Diagnosis not present

## 2022-11-20 DIAGNOSIS — Z419 Encounter for procedure for purposes other than remedying health state, unspecified: Secondary | ICD-10-CM | POA: Diagnosis not present

## 2022-12-20 DIAGNOSIS — Z419 Encounter for procedure for purposes other than remedying health state, unspecified: Secondary | ICD-10-CM | POA: Diagnosis not present

## 2023-01-20 DIAGNOSIS — Z419 Encounter for procedure for purposes other than remedying health state, unspecified: Secondary | ICD-10-CM | POA: Diagnosis not present

## 2023-02-20 DIAGNOSIS — Z419 Encounter for procedure for purposes other than remedying health state, unspecified: Secondary | ICD-10-CM | POA: Diagnosis not present

## 2023-02-24 ENCOUNTER — Other Ambulatory Visit: Payer: Self-pay | Admitting: Obstetrics

## 2023-02-24 DIAGNOSIS — R102 Pelvic and perineal pain: Secondary | ICD-10-CM

## 2023-03-20 DIAGNOSIS — Z419 Encounter for procedure for purposes other than remedying health state, unspecified: Secondary | ICD-10-CM | POA: Diagnosis not present

## 2023-04-06 ENCOUNTER — Encounter: Payer: Self-pay | Admitting: Obstetrics and Gynecology

## 2023-04-06 ENCOUNTER — Ambulatory Visit: Payer: 59 | Admitting: Obstetrics and Gynecology

## 2023-04-06 VITALS — BP 97/62 | HR 93 | Ht 63.0 in | Wt 179.0 lb

## 2023-04-06 DIAGNOSIS — Z30433 Encounter for removal and reinsertion of intrauterine contraceptive device: Secondary | ICD-10-CM

## 2023-04-06 MED ORDER — IBUPROFEN 200 MG PO TABS
600.0000 mg | ORAL_TABLET | Freq: Once | ORAL | Status: AC
  Administered 2023-04-06: 600 mg via ORAL

## 2023-04-06 MED ORDER — LEVONORGESTREL 20 MCG/DAY IU IUD
1.0000 | INTRAUTERINE_SYSTEM | Freq: Once | INTRAUTERINE | Status: AC
  Administered 2023-04-06: 1 via INTRAUTERINE

## 2023-04-06 NOTE — Progress Notes (Signed)
 43 y.o. GYN presents for IUD removal and Insertion.  Last PAP 04/03/2021

## 2023-04-06 NOTE — Addendum Note (Signed)
 Addended by: Natale Milch D on: 04/06/2023 04:28 PM   Modules accepted: Orders

## 2023-04-06 NOTE — Progress Notes (Signed)
 43 yo P4 here for IUD removal and reinsertion. Patient has had the IUD since 2017. She reports amenorrhea with the IUD. She is without any complaints  Past Medical History:  Diagnosis Date   Anemia affecting pregnancy 11/13/2021   Bacterial vaginosis 11/13/2021   Elevated liver enzymes 11/13/2021   Gestational anemia    Obesity (BMI 30-39.9)    SUI (stress urinary incontinence, female)    Past Surgical History:  Procedure Laterality Date   NO PAST SURGERIES     Family History  Problem Relation Age of Onset   Hypertension Paternal Grandmother    Diabetes Paternal Grandmother    Heart disease Paternal Grandmother    Hearing loss Maternal Grandmother        with age   Diabetes Father    Diabetes Mother    Asthma Neg Hx    Stroke Neg Hx    Social History   Tobacco Use   Smoking status: Never   Smokeless tobacco: Never  Vaping Use   Vaping status: Never Used  Substance Use Topics   Alcohol use: No   Drug use: No   ROS See pertinent in HPI. All other systems reviewed and non contributory Blood pressure 97/62, pulse 93, height 5\' 3"  (1.6 m), weight 179 lb (81.2 kg).  GENERAL: Well-developed, well-nourished female in no acute distress.  ABDOMEN: Soft, nontender, nondistended. No organomegaly. PELVIC: Normal external female genitalia. Vagina is pink and rugated.  Normal discharge. Normal appearing cervix. Uterus is normal in size. Chaperone present during the pelvic exam EXTREMITIES: No cyanosis, clubbing, or edema, 2+ distal pulses.  A/P 43 yo P4 here for IUD removal and reinsertion GYNECOLOGY CLINIC PROCEDURE NOTE  No GYN concerns.  Last pap smear was in 2023 and was normal.  IUD Removal  Patient identified, informed consent performed, consent signed.  Patient was in the dorsal lithotomy position, normal external genitalia was noted.  A speculum was placed in the patient's vagina, normal discharge was noted, no lesions. The cervix was visualized, no lesions, no abnormal  discharge.  The strings of the IUD were not visualized, so Kelly forceps were introduced into the endometrial cavity and the IUD was grasped and removed in its entirety.  Patient tolerated the procedure well.    IUD Insertion Cervix visualized.  Cleaned with Betadine x 2.  Grasped anteriorly with a single tooth tenaculum.  Uterus sounded to 9 cm.  Mirena IUD placed per manufacturer's recommendations.  Strings trimmed to 3 cm. Tenaculum was removed, good hemostasis noted.  Patient tolerated procedure well.   Patient given post procedure instructions and Mirena care card with expiration date.  Patient is asked to check IUD strings periodically and follow up in 4-6 weeks for IUD check.

## 2023-05-01 DIAGNOSIS — Z419 Encounter for procedure for purposes other than remedying health state, unspecified: Secondary | ICD-10-CM | POA: Diagnosis not present

## 2023-05-10 ENCOUNTER — Ambulatory Visit: Admitting: Obstetrics and Gynecology

## 2023-05-31 DIAGNOSIS — Z419 Encounter for procedure for purposes other than remedying health state, unspecified: Secondary | ICD-10-CM | POA: Diagnosis not present

## 2023-06-24 ENCOUNTER — Encounter: Payer: Self-pay | Admitting: *Deleted

## 2023-07-01 DIAGNOSIS — Z419 Encounter for procedure for purposes other than remedying health state, unspecified: Secondary | ICD-10-CM | POA: Diagnosis not present

## 2023-07-31 DIAGNOSIS — Z419 Encounter for procedure for purposes other than remedying health state, unspecified: Secondary | ICD-10-CM | POA: Diagnosis not present

## 2023-08-06 ENCOUNTER — Ambulatory Visit (HOSPITAL_COMMUNITY)
Admission: EM | Admit: 2023-08-06 | Discharge: 2023-08-06 | Disposition: A | Discharge status: Home / Self Care | Attending: Family Medicine | Admitting: Family Medicine

## 2023-08-06 ENCOUNTER — Encounter (HOSPITAL_COMMUNITY): Payer: Self-pay

## 2023-08-06 DIAGNOSIS — R09A2 Foreign body sensation, throat: Secondary | ICD-10-CM | POA: Diagnosis not present

## 2023-08-06 DIAGNOSIS — L219 Seborrheic dermatitis, unspecified: Secondary | ICD-10-CM | POA: Diagnosis not present

## 2023-08-06 MED ORDER — FAMOTIDINE 20 MG PO TABS: 20.0000 mg | ORAL_TABLET | Freq: Two times a day (BID) | ORAL | 0 refills | Status: DC

## 2023-08-06 MED ORDER — KETOCONAZOLE 2 % EX SHAM: 1.0000 | MEDICATED_SHAMPOO | CUTANEOUS | 0 refills | Status: DC

## 2023-08-06 NOTE — ED Triage Notes (Addendum)
 Patient reports that she has had a scalp/head rash x 8 months. Patient states she thinks it started after she went to a particular hair salon. Patient states at times the rash peels off.  Patient states she has only changed shampoos every now and then.  Patient added that when she swallows she feels something : on the right side of her neck x 6 months. It feels like something is there every time I swallow.

## 2023-08-06 NOTE — Discharge Instructions (Signed)
 Use Nizoral shampoo 2 times weekly.  Also use T-Gel shampoo over-the-counter another 2 times weekly  Take famotidine 20 mg--1 tablet 2 times daily.  This is for stomach acid and acid reflux.  I wonder if your throat symptoms are from silent reflux   Please follow-up with your primary care about these issues

## 2023-08-06 NOTE — ED Provider Notes (Addendum)
 [facility]    CSN: [id number] Arrival date & time: 08/06/23  1049      History   Chief Complaint Chief Complaint  Patient presents with   head rash   throat issue    HPI Caitlin [name] is a 43 y.o. female.   HPI Here for rash on her scalp for about a year, maybe over 10 months. Since began with a new hairstylist she has had itchy spots on her scalp. Will flake off and can have serous dc.  No other rashes noted  Has tried dandruff shampoos, and one maybe helped a little.  No alopecia  Also feels like something is in her throat when she swallows, but just feels dry.  This began about 6 to 7 months ago.  She feels it may be started after she ate a crunchy carrot or a straw hurt the back of her throat when she was eating out when out of town Past Medical History:  Diagnosis Date   Anemia affecting pregnancy 11/13/2021   Bacterial vaginosis 11/13/2021   Elevated liver enzymes 11/13/2021   Gestational anemia    Obesity (BMI 30-39.9)    SUI (stress urinary incontinence, female)     Patient Active Problem List   Diagnosis Date Noted   Prediabetes 11/20/2021   Hyperlipidemia 11/20/2021   SUI (stress urinary incontinence, female) 11/13/2021   Obesity (BMI 30.0-34.9) 11/13/2021   Papule of skin 11/13/2021    Past Surgical History:  Procedure Laterality Date   NO PAST SURGERIES      OB History     Gravida  4   Para  4   Term  4   Preterm  0   AB  0   Living  4      SAB  0   IAB  0   Ectopic  0   Multiple  0   Live Births  4            Home Medications    Prior to Admission medications   Medication Sig Start Date End Date Taking? Authorizing Provider  famotidine (PEPCID) 20 MG tablet Take 1 tablet (20 mg total) by mouth 2 (two) times daily. 08/06/23  Yes Adron Geisel K, MD  ketoconazole (NIZORAL) 2 % shampoo Apply 1 Application topically 2 (two) times a week. 08/09/23  Yes Vonna Sharlet POUR, MD  levonorgestrel  (MIRENA ) 20  MCG/24HR IUD 1 each by Intrauterine route once.    [provider]    Family History Family History  Problem Relation Age of Onset   Hypertension Paternal Grandmother    Diabetes Paternal Grandmother    Heart disease Paternal Grandmother    Hearing loss Maternal Grandmother        with age   Diabetes Father    Diabetes Mother    Asthma Neg Hx    Stroke Neg Hx     Social History Social History   Tobacco Use   Smoking status: Never   Smokeless tobacco: Never  Vaping Use   Vaping status: Never Used  Substance Use Topics   Alcohol use: No   Drug use: No     Allergies   Patient has no known allergies.   Review of Systems Review of Systems   Physical Exam Triage Vital Signs ED Triage Vitals  Encounter Vitals Group     BP 08/06/23 1112 106/72     Girls Systolic BP Percentile --      Girls Diastolic BP Percentile --  Boys Systolic BP Percentile --      Boys Diastolic BP Percentile --      Pulse Rate 08/06/23 1112 69     Resp 08/06/23 1112 14     Temp 08/06/23 1112 98 F (36.7 C)     Temp Source 08/06/23 1112 Oral     SpO2 08/06/23 1112 95 %     Weight --      Height --      Head Circumference --      Peak Flow --      Pain Score 08/06/23 1114 6     Pain Loc --      Pain Education --      Exclude from Growth Chart --    No data found.  Updated Vital Signs BP 106/72 (BP Location: Left Arm)   Pulse 69   Temp 98 F (36.7 C) (Oral)   Resp 14   SpO2 95%   Visual Acuity Right Eye Distance:   Left Eye Distance:   Bilateral Distance:    Right Eye Near:   Left Eye Near:    Bilateral Near:     Physical Exam Vitals reviewed.  Constitutional:      General: She is not in acute distress.    Appearance: She is not ill-appearing, toxic-appearing or diaphoretic.  HENT:     Nose: Nose normal.     Mouth/Throat:     Mouth: Mucous membranes are moist.     Comments: No erythema or mass or tonsillar hypertrophy Eyes:     Extraocular Movements:  Extraocular movements intact.     Conjunctiva/sclera: Conjunctivae normal.     Pupils: Pupils are equal, round, and reactive to light.  Neck:     Comments: No thyromegaly Cardiovascular:     Rate and Rhythm: Normal rate and regular rhythm.     Heart sounds: No murmur heard. Pulmonary:     Effort: Pulmonary effort is normal.     Breath sounds: Normal breath sounds.  Musculoskeletal:     Cervical back: Neck supple.  Lymphadenopathy:     Cervical: No cervical adenopathy.  Skin:    Coloration: Skin is not jaundiced or pale.     Comments: There is some patchy erythematous spots, mainly on her bilateral occipital areas.  They do have flaky white scaling that is coming off.  No sign of secondary infection  Neurological:     General: No focal deficit present.     Mental Status: She is alert and oriented to person, place, and time.  Psychiatric:        Behavior: Behavior normal.      UC Treatments / Results  Labs (all labs ordered are listed, but only abnormal results are displayed) Labs Reviewed - No data to display  EKG   Radiology No results found.  Procedures Procedures (including critical care time)  Medications Ordered in UC Medications - No data to display  Initial Impression / Assessment and Plan / UC Course  I have reviewed the triage vital signs and the nursing notes.  Pertinent labs & imaging results that were available during my care of the patient were reviewed by me and considered in my medical decision making (see chart for details).     Nizoral shampoo is sent in for possible seborrheic dermatitis and I have recommended T-Gel over-the-counter shampoo.  Pepcid is sent in for her to try to see if her throat symptoms are related to silent reflux.  I have asked her  to follow-up with her primary care about these issues. Final Clinical Impressions(s) / UC Diagnoses   Final diagnoses:  Seborrheic dermatitis  Globus sensation     Discharge Instructions       Use Nizoral shampoo 2 times weekly.  Also use T-Gel shampoo over-the-counter another 2 times weekly  Take famotidine 20 mg--1 tablet 2 times daily.  This is for stomach acid and acid reflux.  I wonder if your throat symptoms are from silent reflux   Please follow-up with your primary care about these issues       ED Prescriptions     Medication Sig Dispense Auth. Provider   ketoconazole (NIZORAL) 2 % shampoo Apply 1 Application topically 2 (two) times a week. 120 mL Vonna Sharlet POUR, MD   famotidine (PEPCID) 20 MG tablet Take 1 tablet (20 mg total) by mouth 2 (two) times daily. 30 tablet Zevin Nevares K, MD      PDMP not reviewed this encounter.   Vonna Sharlet POUR, MD 08/06/23 1206    Vonna Sharlet POUR, MD 08/06/23 [phone]

## 2023-08-18 ENCOUNTER — Ambulatory Visit: Payer: Self-pay | Admitting: Student

## 2023-08-18 ENCOUNTER — Telehealth: Payer: Self-pay

## 2023-08-18 ENCOUNTER — Other Ambulatory Visit: Payer: Self-pay

## 2023-08-18 ENCOUNTER — Other Ambulatory Visit (HOSPITAL_COMMUNITY): Payer: Self-pay

## 2023-08-18 VITALS — BP 110/71 | HR 69 | Temp 98.1°F | Ht 63.0 in | Wt 177.0 lb

## 2023-08-18 DIAGNOSIS — L219 Seborrheic dermatitis, unspecified: Secondary | ICD-10-CM

## 2023-08-18 DIAGNOSIS — R09A2 Foreign body sensation, throat: Secondary | ICD-10-CM

## 2023-08-18 DIAGNOSIS — R7303 Prediabetes: Secondary | ICD-10-CM | POA: Diagnosis not present

## 2023-08-18 DIAGNOSIS — E785 Hyperlipidemia, unspecified: Secondary | ICD-10-CM | POA: Diagnosis not present

## 2023-08-18 LAB — POCT GLYCOSYLATED HEMOGLOBIN (HGB A1C): Hemoglobin A1C: 5.6 % (ref 4.0–5.6)

## 2023-08-18 LAB — GLUCOSE, CAPILLARY: Glucose-Capillary: 109 mg/dL — ABNORMAL HIGH (ref 70–99)

## 2023-08-18 MED ORDER — KETOCONAZOLE 2 % EX SHAM
1.0000 | MEDICATED_SHAMPOO | CUTANEOUS | 1 refills | Status: DC
  Filled 2023-08-18: qty 120, 28d supply, fill #0
  Filled 2023-09-10 – 2023-10-18 (×2): qty 120, 28d supply, fill #1

## 2023-08-18 MED ORDER — FAMOTIDINE 20 MG PO TABS
20.0000 mg | ORAL_TABLET | Freq: Two times a day (BID) | ORAL | 0 refills | Status: DC
  Filled 2023-08-18: qty 60, 30d supply, fill #0

## 2023-08-18 NOTE — Telephone Encounter (Signed)
Interpreter waiver signed

## 2023-08-18 NOTE — Progress Notes (Unsigned)
 CC: Physical and follow-up  HPI:  [name] is a 43 y.o. female living with a history stated below and presents today for follow-up. Please see problem based assessment and plan for additional details.  Past Medical History:  Diagnosis Date   Anemia affecting pregnancy 11/13/2021   Bacterial vaginosis 11/13/2021   Elevated liver enzymes 11/13/2021   Gestational anemia    Obesity (BMI 30-39.9)    SUI (stress urinary incontinence, female)     Current Outpatient Medications on File Prior to Visit  Medication Sig Dispense Refill   levonorgestrel  (MIRENA ) 20 MCG/24HR IUD 1 each by Intrauterine route once.     No current facility-administered medications on file prior to visit.    Family History  Problem Relation Age of Onset   Hypertension Paternal Grandmother    Diabetes Paternal Grandmother    Heart disease Paternal Grandmother    Hearing loss Maternal Grandmother        with age   Diabetes Father    Diabetes Mother    Asthma Neg Hx    Stroke Neg Hx     Social History   Socioeconomic History   Marital status: Single    Spouse name: Not on file   Number of children: 4   Years of education: Not on file   Highest education level: High school graduate  Occupational History   Not on file  Tobacco Use   Smoking status: Never   Smokeless tobacco: Never  Vaping Use   Vaping status: Never Used  Substance and Sexual Activity   Alcohol use: No   Drug use: No   Sexual activity: Yes    Partners: Male    Birth control/protection: I.U.D.  Other Topics Concern   Not on file  Social History Narrative   Not on file   Social Drivers of Health   Financial Resource Strain: Low Risk  (08/18/2023)   Overall Financial Resource Strain (CARDIA)    Difficulty of Paying Living Expenses: Not hard at all  Food Insecurity: Food Insecurity Present (08/18/2023)   Hunger Vital Sign    Worried About Running Out of Food in the Last Year: Sometimes true    Ran Out of Food in the  Last Year: Never true  Transportation Needs: Unknown (08/18/2023)   PRAPARE - Administrator, Civil Service (Medical): Patient declined    Lack of Transportation (Non-Medical): No  Physical Activity: Inactive (08/18/2023)   Exercise Vital Sign    Days of Exercise per Week: 0 days    Minutes of Exercise per Session: 0 min  Stress: No Stress Concern Present (08/18/2023)   Caitlin [name] [employer] Occupational [employer] - Occupational Stress Questionnaire    Feeling of Stress: Only a little  Social Connections: Moderately Integrated (08/18/2023)   Social Connection and Isolation Panel    Frequency of Communication with Friends and Family: More than three times a week    Frequency of Social Gatherings with Friends and Family: Twice a week    Attends Religious Services: More than 4 times per year    Active Member of [employer] or Organizations: No    Attends Banker Meetings: Never    Marital Status: Married  Catering manager Violence: Not At Risk (08/18/2023)   Humiliation, Afraid, Rape, and Kick questionnaire    Fear of Current or Ex-Partner: No    Emotionally Abused: No    Physically Abused: No    Sexually Abused: No    Review of Systems: ROS  negative except for what is noted on the assessment and plan.  Vitals:   08/18/23 0820  BP: 110/71  Pulse: 69  Temp: 98.1 F (36.7 C)  TempSrc: Oral  SpO2: 97%  Weight: 177 lb (80.3 kg)  Height: 5' 3 (1.6 m)    Physical Exam: Constitutional: obese, sitting in chair, in no acute distress Head: scattered yellow-white flakiness throughout hair, most notably on bilateral temporal regions Cardiovascular: regular rate and rhythm, no m/r/g Pulmonary/Chest: normal work of breathing on room air, lungs clear to auscultation bilaterally  Assessment & Plan:     Patient discussed with Dr. Jeanelle  Prediabetes A1c is 5.6, down from 6.0. Continue to focus on lifestyle modifications.   Seborrheic dermatitis of scalp No prior  history with this. Will manage with Nizoral  shampoo twice weekly for now.  Hyperlipidemia LDL of 121 last year. Will re-check again today and discuss potential treatment with goal of primary prevention < 100.  Globus sensation Began a few weeks ago. Denies reflux symptoms but does eat a lot of spicy foods. Denies odynophagia. This could be silent reflux so will try Pepcid  for now and escalate evaluation if needed (e.g. EOE).    Caitlin [name], D.O. [facility] Internal Medicine, PGY-2 Phone: [phone] Date 08/19/2023 Time 1:23 PM

## 2023-08-19 ENCOUNTER — Ambulatory Visit: Payer: Self-pay | Admitting: Student

## 2023-08-19 DIAGNOSIS — L219 Seborrheic dermatitis, unspecified: Secondary | ICD-10-CM | POA: Insufficient documentation

## 2023-08-19 DIAGNOSIS — R09A2 Foreign body sensation, throat: Secondary | ICD-10-CM | POA: Insufficient documentation

## 2023-08-19 LAB — BASIC METABOLIC PANEL WITH GFR
BUN/Creatinine Ratio: 24 — ABNORMAL HIGH (ref 9–23)
BUN: 16 mg/dL (ref 6–24)
CO2: 20 mmol/L (ref 20–29)
Calcium: 8.6 mg/dL — ABNORMAL LOW (ref 8.7–10.2)
Chloride: 105 mmol/L (ref 96–106)
Creatinine, Ser: 0.66 mg/dL (ref 0.57–1.00)
Glucose: 103 mg/dL — ABNORMAL HIGH (ref 70–99)
Potassium: 4.1 mmol/L (ref 3.5–5.2)
Sodium: 140 mmol/L (ref 134–144)
eGFR: 112 mL/min/1.73 (ref >59)

## 2023-08-19 LAB — LIPID PANEL
Chol/HDL Ratio: 5 ratio — ABNORMAL HIGH (ref 0.0–4.4)
Cholesterol, Total: 185 mg/dL (ref 100–199)
HDL: 37 mg/dL — ABNORMAL LOW (ref >39)
LDL Chol Calc (NIH): 123 mg/dL — ABNORMAL HIGH (ref 0–99)
Triglycerides: 139 mg/dL (ref 0–149)
VLDL Cholesterol Cal: 25 mg/dL (ref 5–40)

## 2023-08-19 NOTE — Assessment & Plan Note (Signed)
 A1c is 5.6, down from 6.0. Continue to focus on lifestyle modifications.

## 2023-08-19 NOTE — Progress Notes (Signed)
 Internal Medicine Clinic Attending  Case discussed with the resident at the time of the visit.  We reviewed the resident's history and exam and pertinent patient test results.  I agree with the assessment, diagnosis, and plan of care documented in the resident's note.

## 2023-08-19 NOTE — Assessment & Plan Note (Signed)
 No prior history with this. Will manage with Nizoral  shampoo twice weekly for now.

## 2023-08-19 NOTE — Assessment & Plan Note (Signed)
 Began a few weeks ago. Denies reflux symptoms but does eat a lot of spicy foods. Denies odynophagia. This could be silent reflux so will try Pepcid  for now and escalate evaluation if needed (e.g. EOE).

## 2023-08-19 NOTE — Assessment & Plan Note (Signed)
 LDL of 121 last year. Will re-check again today and discuss potential treatment with goal of primary prevention < 100.

## 2023-08-31 DIAGNOSIS — Z419 Encounter for procedure for purposes other than remedying health state, unspecified: Secondary | ICD-10-CM | POA: Diagnosis not present

## 2023-09-06 ENCOUNTER — Inpatient Hospital Stay (HOSPITAL_BASED_OUTPATIENT_CLINIC_OR_DEPARTMENT_OTHER): Admission: RE | Admit: 2023-09-06 | Source: Ambulatory Visit | Admitting: Radiology

## 2023-09-06 DIAGNOSIS — Z1231 Encounter for screening mammogram for malignant neoplasm of breast: Secondary | ICD-10-CM

## 2023-09-07 ENCOUNTER — Ambulatory Visit: Admitting: Obstetrics

## 2023-09-10 ENCOUNTER — Other Ambulatory Visit: Payer: Self-pay | Admitting: Student

## 2023-09-10 ENCOUNTER — Other Ambulatory Visit: Payer: Self-pay

## 2023-09-10 DIAGNOSIS — R09A2 Foreign body sensation, throat: Secondary | ICD-10-CM

## 2023-09-13 ENCOUNTER — Other Ambulatory Visit (HOSPITAL_COMMUNITY): Payer: Self-pay

## 2023-09-13 ENCOUNTER — Other Ambulatory Visit: Payer: Self-pay

## 2023-09-13 MED FILL — Famotidine Tab 20 MG: ORAL | 30 days supply | Qty: 60 | Fill #0 | Status: CN

## 2023-09-21 ENCOUNTER — Other Ambulatory Visit (HOSPITAL_COMMUNITY): Payer: Self-pay

## 2023-09-22 ENCOUNTER — Other Ambulatory Visit (HOSPITAL_COMMUNITY): Payer: Self-pay

## 2023-10-01 DIAGNOSIS — Z419 Encounter for procedure for purposes other than remedying health state, unspecified: Secondary | ICD-10-CM | POA: Diagnosis not present

## 2023-10-18 ENCOUNTER — Other Ambulatory Visit (HOSPITAL_COMMUNITY)
Admission: RE | Admit: 2023-10-18 | Discharge: 2023-10-18 | Disposition: A | Discharge status: Home / Self Care | Source: Ambulatory Visit | Attending: Obstetrics | Admitting: Obstetrics

## 2023-10-18 ENCOUNTER — Encounter: Payer: Self-pay | Admitting: Obstetrics

## 2023-10-18 ENCOUNTER — Other Ambulatory Visit (HOSPITAL_COMMUNITY): Payer: Self-pay

## 2023-10-18 ENCOUNTER — Ambulatory Visit: Admitting: Obstetrics

## 2023-10-18 ENCOUNTER — Other Ambulatory Visit: Payer: Self-pay

## 2023-10-18 VITALS — BP 100/67 | HR 70 | Ht 63.0 in | Wt 180.9 lb

## 2023-10-18 DIAGNOSIS — N898 Other specified noninflammatory disorders of vagina: Secondary | ICD-10-CM | POA: Insufficient documentation

## 2023-10-18 DIAGNOSIS — Z01419 Encounter for gynecological examination (general) (routine) without abnormal findings: Secondary | ICD-10-CM | POA: Diagnosis not present

## 2023-10-18 DIAGNOSIS — N946 Dysmenorrhea, unspecified: Secondary | ICD-10-CM | POA: Diagnosis not present

## 2023-10-18 DIAGNOSIS — R7303 Prediabetes: Secondary | ICD-10-CM | POA: Diagnosis not present

## 2023-10-18 DIAGNOSIS — Z1239 Encounter for other screening for malignant neoplasm of breast: Secondary | ICD-10-CM

## 2023-10-18 DIAGNOSIS — E66811 Obesity, class 1: Secondary | ICD-10-CM

## 2023-10-18 MED ORDER — IBUPROFEN 800 MG PO TABS: 800.0000 mg | ORAL_TABLET | Freq: Three times a day (TID) | ORAL | 5 refills | Status: AC | PRN

## 2023-10-18 MED FILL — Famotidine Tab 20 MG: ORAL | 30 days supply | Qty: 60 | Fill #0 | Status: AC

## 2023-10-18 NOTE — Progress Notes (Signed)
 Subjective:        Caitlin [name] is a 43 y.o. female here for a routine exam.  Current complaints: Vaginal discharge.    Personal health questionnaire:  Is patient Ashkenazi Jewish, have a family history of breast and/or ovarian cancer: no Is there a family history of uterine cancer diagnosed at age < 40, gastrointestinal cancer, urinary tract cancer, family member who is a Personnel officer syndrome-associated carrier: no Is the patient overweight and hypertensive, family history of diabetes, personal history of gestational diabetes, preeclampsia or PCOS: no Is patient over 74, have PCOS,  family history of premature CHD under age 26, diabetes, smoke, have hypertension or peripheral artery disease:  no At any time, has a partner hit, kicked or otherwise hurt or frightened you?: no Over the past 2 weeks, have you felt down, depressed or hopeless?: no Over the past 2 weeks, have you felt little interest or pleasure in doing things?:no   Gynecologic History No LMP recorded (lmp unknown). (Menstrual status: IUD). Contraception: IUD Last Pap: 2023. Results were: normal Last mammogram: 2023. Results were: normal  Obstetric History OB History  Gravida Para Term Preterm AB Living  4 4 4  0 0 4  SAB IAB Ectopic Multiple Live Births  0 0 0 0 4    # Outcome Date GA Lbr Len/2nd Weight Sex Type Anes PTL Lv  4 Term 01/23/15 [redacted]w[redacted]d 181:54 / 01:19 9 lb 14.6 oz (4.495 kg) F Vag-Spont EPI  LIV  3 Term 02/07/04 [redacted]w[redacted]d  8 lb 12 oz (3.969 kg) M Vag-Spont None N LIV  2 Term 11/04/01 [redacted]w[redacted]d  9 lb (4.082 kg) M Vag-Spont EPI N LIV  1 Term 08/19/99 [redacted]w[redacted]d  7 lb (3.175 kg) F Vag-Spont None N LIV    Past Medical History:  Diagnosis Date   Anemia affecting pregnancy 11/13/2021   Bacterial vaginosis 11/13/2021   Elevated liver enzymes 11/13/2021   Gestational anemia    Obesity (BMI 30-39.9)    SUI (stress urinary incontinence, female)     Past Surgical History:  Procedure Laterality Date   NO PAST SURGERIES        Current Outpatient Medications:    ibuprofen  (ADVIL ) 800 MG tablet, Take 1 tablet (800 mg total) by mouth every 8 (eight) hours as needed., Disp: 30 tablet, Rfl: 5   ketoconazole  (NIZORAL ) 2 % shampoo, Apply 1 Application topically 2 (two) times a week., Disp: 120 mL, Rfl: 1   levonorgestrel  (MIRENA ) 20 MCG/24HR IUD, 1 each by Intrauterine route once., Disp: , Rfl:    famotidine  (PEPCID ) 20 MG tablet, Take 1 tablet (20 mg total) by mouth 2 (two) times daily. (Patient not taking: Reported on 10/18/2023), Disp: 60 tablet, Rfl: 1 No Known Allergies  Social History   Tobacco Use   Smoking status: Never   Smokeless tobacco: Never  Substance Use Topics   Alcohol use: No    Family History  Problem Relation Age of Onset   Hypertension Paternal Grandmother    Diabetes Paternal Grandmother    Heart disease Paternal Grandmother    Hearing loss Maternal Grandmother        with age   Diabetes Father    Diabetes Mother    Asthma Neg Hx    Stroke Neg Hx       Review of Systems  Constitutional: negative for fatigue and weight loss Respiratory: negative for cough and wheezing Cardiovascular: negative for chest pain, fatigue and palpitations Gastrointestinal: negative for abdominal pain and change in bowel  habits Musculoskeletal:negative for myalgias Neurological: negative for gait problems and tremors Behavioral/Psych: negative for abusive relationship, depression Endocrine: negative for temperature intolerance    Genitourinary: positive for vaginal discharge.  negative for abnormal menstrual periods, genital lesions, hot flashes, sexual problems  Integument/breast: negative for breast lump, breast tenderness, nipple discharge and skin lesion(s)    Objective:       BP 100/67   Pulse 70   Ht 5' 3 (1.6 m)   Wt 180 lb 14.4 oz (82.1 kg)   LMP  (LMP Unknown)   BMI 32.04 kg/m  General:   Alert and no distress  Skin:   no rash or abnormalities  Lungs:   clear to auscultation  bilaterally  Heart:   regular rate and rhythm, S1, S2 normal, no murmur, click, rub or gallop  Breasts:   normal without suspicious masses, skin or nipple changes or axillary nodes  Abdomen:  normal findings: no organomegaly, soft, non-tender and no hernia  Pelvis:  External genitalia: normal general appearance Urinary system: urethral meatus normal and bladder without fullness, nontender Vaginal: normal without tenderness, induration or masses Cervix: normal appearance Adnexa: normal bimanual exam Uterus: anteverted and non-tender, normal size   Lab Review Urine pregnancy test Labs reviewed yes Radiologic studies reviewed yes  I have spent a total of 20 minutes of face-to-face time, excluding clinical staff time, reviewing notes and preparing to see patient, ordering tests and/or medications, and counseling the patient.   Assessment:    1. Encounter for gynecological examination with Papanicolaou smear of cervix (Primary) Rx: - Cytology - PAP( [place])  2. Dysmenorrhea Rx: - ibuprofen  (ADVIL ) 800 MG tablet; Take 1 tablet (800 mg total) by mouth every 8 (eight) hours as needed.  Dispense: 30 tablet; Refill: 5  3. Vaginal discharge Rx: - Cervicovaginal ancillary only( ) - HIV antibody (with reflex) - RPR - Hepatitis C Antibody - Hepatitis B Surface AntiGEN  4. Screening breast examination Rx: - MM 3D SCREENING MAMMOGRAM BILATERAL BREAST; Future  5. Prediabetes  6. Obesity (BMI 30.0-34.9)     Plan:    Education reviewed: calcium supplements, depression evaluation, low fat, low cholesterol diet, safe sex/STD prevention, self breast exams, and weight bearing exercise. Contraception: IUD. Mammogram ordered. Follow up in: 1 year.   Meds ordered this encounter  Medications   ibuprofen  (ADVIL ) 800 MG tablet    Sig: Take 1 tablet (800 mg total) by mouth every 8 (eight) hours as needed.    Dispense:  30 tablet    Refill:  5   Orders Placed This  Encounter  Procedures   MM 3D SCREENING MAMMOGRAM BILATERAL BREAST    INS:[id number] ANNUAL// NO PROBLEMS// NO HX OF BREAST CANCER  PREV: 09-10-22 [facility]  NO NEEDS EG SW PT  PT AWARE $75 NO SHOW/CANCELLATION FEE WITHIN 24 HOURS    Standing Status:   Future    Expiration Date:   10/17/2024    Reason for Exam (SYMPTOM  OR DIAGNOSIS REQUIRED):   sCREENING    Is the patient pregnant?:   No    Preferred imaging location?:   [facility]   HIV antibody (with reflex)   RPR   Hepatitis C Antibody   Hepatitis B Surface AntiGEN    CARLIN RONAL CENTERS, MD, FACOG Attending Obstetrician & Gynecologist, [facility] for [facility], [facility], [place] 10/18/2023

## 2023-10-18 NOTE — Progress Notes (Signed)
 labPt presents for annual.

## 2023-10-19 LAB — RPR: RPR Ser Ql: NONREACTIVE

## 2023-10-19 LAB — CERVICOVAGINAL ANCILLARY ONLY
Bacterial Vaginitis (gardnerella): NEGATIVE
Candida Glabrata: NEGATIVE
Candida Vaginitis: NEGATIVE
Chlamydia: NEGATIVE
Comment: NEGATIVE
Comment: NEGATIVE
Comment: NEGATIVE
Comment: NEGATIVE
Comment: NEGATIVE
Comment: NORMAL
Neisseria Gonorrhea: NEGATIVE
Trichomonas: NEGATIVE

## 2023-10-19 LAB — HEPATITIS B SURFACE ANTIGEN: Hepatitis B Surface Ag: NEGATIVE

## 2023-10-19 LAB — HEPATITIS C ANTIBODY: Hep C Virus Ab: NONREACTIVE

## 2023-10-19 LAB — HIV ANTIBODY (ROUTINE TESTING W REFLEX): HIV Screen 4th Generation wRfx: NONREACTIVE

## 2023-10-20 LAB — CYTOLOGY - PAP
Comment: NEGATIVE
Diagnosis: NEGATIVE
High risk HPV: NEGATIVE

## 2023-11-02 ENCOUNTER — Ambulatory Visit

## 2023-11-25 ENCOUNTER — Other Ambulatory Visit (HOSPITAL_COMMUNITY): Payer: Self-pay

## 2023-11-25 ENCOUNTER — Other Ambulatory Visit: Payer: Self-pay

## 2023-11-25 MED FILL — Famotidine Tab 20 MG: ORAL | 30 days supply | Qty: 60 | Fill #1 | Status: CN

## 2023-11-29 ENCOUNTER — Other Ambulatory Visit: Payer: Self-pay | Admitting: Student

## 2023-11-29 ENCOUNTER — Other Ambulatory Visit: Payer: Self-pay

## 2023-11-29 ENCOUNTER — Other Ambulatory Visit (HOSPITAL_COMMUNITY): Payer: Self-pay

## 2023-11-29 DIAGNOSIS — L219 Seborrheic dermatitis, unspecified: Secondary | ICD-10-CM

## 2023-11-29 MED FILL — Ketoconazole Shampoo 2%: CUTANEOUS | 84 days supply | Qty: 120 | Fill #0 | Status: CN

## 2023-12-02 ENCOUNTER — Encounter (HOSPITAL_BASED_OUTPATIENT_CLINIC_OR_DEPARTMENT_OTHER): Payer: Self-pay

## 2023-12-02 ENCOUNTER — Other Ambulatory Visit (HOSPITAL_BASED_OUTPATIENT_CLINIC_OR_DEPARTMENT_OTHER): Payer: Self-pay | Admitting: Obstetrics

## 2023-12-02 DIAGNOSIS — Z1231 Encounter for screening mammogram for malignant neoplasm of breast: Secondary | ICD-10-CM

## 2023-12-03 ENCOUNTER — Inpatient Hospital Stay (HOSPITAL_BASED_OUTPATIENT_CLINIC_OR_DEPARTMENT_OTHER): Admission: RE | Admit: 2023-12-03 | Source: Ambulatory Visit | Admitting: Radiology

## 2023-12-03 DIAGNOSIS — Z1231 Encounter for screening mammogram for malignant neoplasm of breast: Secondary | ICD-10-CM

## 2023-12-05 ENCOUNTER — Other Ambulatory Visit (HOSPITAL_COMMUNITY): Payer: Self-pay

## 2023-12-11 ENCOUNTER — Other Ambulatory Visit (HOSPITAL_COMMUNITY): Payer: Self-pay
